# Patient Record
Sex: Male | Born: 1945
Health system: Southern US, Community
[De-identification: ages and names within clinical notes are randomized; demographics above are authoritative.]

## PROBLEM LIST (undated history)

## (undated) DIAGNOSIS — H269 Unspecified cataract: Secondary | ICD-10-CM

## (undated) DIAGNOSIS — R42 Dizziness and giddiness: Secondary | ICD-10-CM

## (undated) DIAGNOSIS — G4730 Sleep apnea, unspecified: Secondary | ICD-10-CM

## (undated) DIAGNOSIS — E785 Hyperlipidemia, unspecified: Secondary | ICD-10-CM

## (undated) DIAGNOSIS — K219 Gastro-esophageal reflux disease without esophagitis: Secondary | ICD-10-CM

## (undated) DIAGNOSIS — R002 Palpitations: Secondary | ICD-10-CM

## (undated) DIAGNOSIS — K2270 Barrett's esophagus without dysplasia: Secondary | ICD-10-CM

## (undated) HISTORY — DX: Dizziness and giddiness: R42

## (undated) HISTORY — PX: UPPER GASTROINTESTINAL ENDOSCOPY: SHX188

## (undated) HISTORY — PX: WISDOM TOOTH EXTRACTION: SHX21

## (undated) HISTORY — DX: Gastro-esophageal reflux disease without esophagitis: K21.9

## (undated) HISTORY — DX: Unspecified cataract: H26.9

## (undated) HISTORY — DX: Hyperlipidemia, unspecified: E78.5

## (undated) HISTORY — DX: Sleep apnea, unspecified: G47.30

## (undated) HISTORY — DX: Barrett's esophagus without dysplasia: K22.70

## (undated) HISTORY — PX: COLONOSCOPY: SHX174

---
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
## 1995-09-20 HISTORY — PX: KNEE ARTHROSCOPY: SUR90

## 1998-07-07 ENCOUNTER — Ambulatory Visit (HOSPITAL_COMMUNITY): Admission: RE | Admit: 1998-07-07 | Discharge: 1998-07-07 | Payer: Self-pay | Admitting: *Deleted

## 2000-11-13 ENCOUNTER — Ambulatory Visit (HOSPITAL_COMMUNITY): Admission: RE | Admit: 2000-11-13 | Discharge: 2000-11-13 | Payer: Self-pay | Admitting: *Deleted

## 2002-11-21 ENCOUNTER — Encounter: Payer: Self-pay | Admitting: Internal Medicine

## 2002-11-21 ENCOUNTER — Encounter: Admission: RE | Admit: 2002-11-21 | Discharge: 2002-11-21 | Payer: Self-pay | Admitting: Internal Medicine

## 2005-09-19 HISTORY — PX: CATARACT EXTRACTION W/ INTRAOCULAR LENS  IMPLANT, BILATERAL: SHX1307

## 2006-11-06 ENCOUNTER — Ambulatory Visit (HOSPITAL_COMMUNITY): Admission: RE | Admit: 2006-11-06 | Discharge: 2006-11-06 | Payer: Self-pay | Admitting: *Deleted

## 2012-02-01 ENCOUNTER — Encounter: Payer: Self-pay | Admitting: Internal Medicine

## 2012-03-09 ENCOUNTER — Ambulatory Visit (AMBULATORY_SURGERY_CENTER): Payer: BC Managed Care – PPO | Admitting: *Deleted

## 2012-03-09 ENCOUNTER — Telehealth: Payer: Self-pay | Admitting: *Deleted

## 2012-03-09 ENCOUNTER — Encounter: Payer: Self-pay | Admitting: Internal Medicine

## 2012-03-09 VITALS — Ht 71.0 in | Wt 206.0 lb

## 2012-03-09 DIAGNOSIS — K2270 Barrett's esophagus without dysplasia: Secondary | ICD-10-CM

## 2012-03-09 NOTE — Telephone Encounter (Signed)
I agree with recall EGD for Barrett's and change the date for recall colon for 2018.

## 2012-03-09 NOTE — Progress Notes (Signed)
When I printed Kenneth Ingram past colon and EGD reports I saw where he had normal colonoscopies in 2002 and 2008 but his EGD showed Barrett's.  I changed his procedure to and Endoscopy and put a recall in for 2018 for his colon.  He was a pt of Dr. Virginia Rochester.

## 2012-03-09 NOTE — Telephone Encounter (Signed)
When I printed Kenneth Ingram past colon and EGD reports I saw where he had normal colonoscopies in 2002 and 2008 but his EGD showed Barrett's.  I changed his procedure to and Endoscopy and put a recall in for 2018 for his colon.  He was a pt of Dr. Virginia Rochester.  Does this meet with your approval?  Wyona Almas

## 2012-03-12 NOTE — Telephone Encounter (Signed)
I spoke with Mrs. Conly and informed her Mr. Lorusso was due to have his EGD instead of a colon and discussed preparation for it and also mailed Mr. Gongora his prep instructions for EGD.

## 2012-03-23 ENCOUNTER — Ambulatory Visit (AMBULATORY_SURGERY_CENTER): Payer: BC Managed Care – PPO | Admitting: Internal Medicine

## 2012-03-23 ENCOUNTER — Encounter: Payer: Self-pay | Admitting: Internal Medicine

## 2012-03-23 VITALS — BP 107/75 | HR 56 | Temp 97.4°F | Resp 12 | Ht 71.0 in | Wt 206.0 lb

## 2012-03-23 DIAGNOSIS — K2270 Barrett's esophagus without dysplasia: Secondary | ICD-10-CM

## 2012-03-23 DIAGNOSIS — K2960 Other gastritis without bleeding: Secondary | ICD-10-CM

## 2012-03-23 MED ORDER — SODIUM CHLORIDE 0.9 % IV SOLN: 500.0000 mL | INTRAVENOUS | Status: DC

## 2012-03-23 NOTE — Op Note (Signed)
[facility] [address] [place], [place]  [address]  ENDOSCOPY PROCEDURE REPORT  PATIENT:  Kenneth Ingram, Kenneth Ingram  MR#:  [id number] BIRTHDATE:  March 15, 1946, 66 yrs. old  GENDER:  male  ENDOSCOPIST:  Hedwig Morton. Juanda Chance, MD Referred by:  Soyla Murphy. Renne Crigler, M.D.  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  The LB GIF-H180 [id number] endoscope was introduced through the mouth and advanced to the second portion of the duodenum, without limitations.  The instrument was slowly withdrawn as the mucosa was fully examined. <<PROCEDUREIMAGES>>  irregular Z-line. With standard forceps, a biopsy was obtained and sent to pathology (see image1, image6, image7, image8, and image9).  A hiatal hernia was found (see image7). small reducible 1-2 cm hiatal hernia  Mild gastritis was found. streaks of erythema antrum With standard forceps, a biopsy was obtained and sent to pathology (see image4 and image2).  Otherwise the examination was normal (see image3 and image5).    Retroflexed views revealed no abnormalities.    The scope was then withdrawn from the patient and the procedure completed.  COMPLICATIONS:  None  ENDOSCOPIC IMPRESSION: 1) Irregular Z-line 2) Hiatal hernia 3) Mild gastritis 4) Otherwise normal examination s/p biopsies gastric antrum to r/o H.pylori s/o Bx's at g-e junction RECOMMENDATIONS: 1) Await biopsy results 2) Anti-reflux regimen to be follow continue current meds  REPEAT EXAM:  In 3 year(s) for.  3 year recall if Barrett's esophagus  present  ______________________________ Hedwig Morton. Juanda Chance, MD  CC:  n. eSIGNED:   Hedwig Morton. Nathanyal Ashmead at 03/23/2012 12:04 PM  Reina Fuse, [phone]

## 2012-03-23 NOTE — Progress Notes (Signed)
Patient did not experience any of the following events: a burn prior to discharge; a fall within the facility; wrong site/side/patient/procedure/implant event; or a hospital transfer or hospital admission upon discharge from the facility. (G8907) Patient did not have preoperative order for IV antibiotic SSI prophylaxis. (G8918)  

## 2012-03-23 NOTE — Patient Instructions (Addendum)

## 2012-03-26 ENCOUNTER — Telehealth: Payer: Self-pay

## 2012-03-26 NOTE — Telephone Encounter (Signed)
Left message

## 2012-03-29 ENCOUNTER — Encounter: Payer: Self-pay | Admitting: Internal Medicine

## 2014-11-25 ENCOUNTER — Other Ambulatory Visit: Payer: Self-pay | Admitting: Internal Medicine

## 2014-11-25 DIAGNOSIS — F17210 Nicotine dependence, cigarettes, uncomplicated: Secondary | ICD-10-CM

## 2014-12-01 ENCOUNTER — Other Ambulatory Visit: Payer: Self-pay | Admitting: Internal Medicine

## 2014-12-01 ENCOUNTER — Ambulatory Visit
Admission: RE | Admit: 2014-12-01 | Discharge: 2014-12-01 | Disposition: A | Discharge status: Home / Self Care | Payer: PPO | Source: Ambulatory Visit | Attending: Internal Medicine | Admitting: Internal Medicine

## 2014-12-01 DIAGNOSIS — F17210 Nicotine dependence, cigarettes, uncomplicated: Secondary | ICD-10-CM

## 2014-12-01 DIAGNOSIS — Z87891 Personal history of nicotine dependence: Secondary | ICD-10-CM

## 2015-11-09 DIAGNOSIS — J069 Acute upper respiratory infection, unspecified: Secondary | ICD-10-CM | POA: Diagnosis not present

## 2015-11-20 DIAGNOSIS — Z125 Encounter for screening for malignant neoplasm of prostate: Secondary | ICD-10-CM | POA: Diagnosis not present

## 2015-11-20 DIAGNOSIS — Z0000 Encounter for general adult medical examination without abnormal findings: Secondary | ICD-10-CM | POA: Diagnosis not present

## 2015-11-26 DIAGNOSIS — G4733 Obstructive sleep apnea (adult) (pediatric): Secondary | ICD-10-CM | POA: Diagnosis not present

## 2015-11-26 DIAGNOSIS — R5383 Other fatigue: Secondary | ICD-10-CM | POA: Diagnosis not present

## 2015-11-26 DIAGNOSIS — J8410 Pulmonary fibrosis, unspecified: Secondary | ICD-10-CM | POA: Diagnosis not present

## 2015-11-26 DIAGNOSIS — Z1212 Encounter for screening for malignant neoplasm of rectum: Secondary | ICD-10-CM | POA: Diagnosis not present

## 2015-11-26 DIAGNOSIS — Z0000 Encounter for general adult medical examination without abnormal findings: Secondary | ICD-10-CM | POA: Diagnosis not present

## 2015-12-09 DIAGNOSIS — L821 Other seborrheic keratosis: Secondary | ICD-10-CM | POA: Diagnosis not present

## 2015-12-09 DIAGNOSIS — D1801 Hemangioma of skin and subcutaneous tissue: Secondary | ICD-10-CM | POA: Diagnosis not present

## 2015-12-09 DIAGNOSIS — D225 Melanocytic nevi of trunk: Secondary | ICD-10-CM | POA: Diagnosis not present

## 2016-03-04 DIAGNOSIS — M5441 Lumbago with sciatica, right side: Secondary | ICD-10-CM | POA: Diagnosis not present

## 2016-03-04 DIAGNOSIS — M5442 Lumbago with sciatica, left side: Secondary | ICD-10-CM | POA: Diagnosis not present

## 2016-03-04 DIAGNOSIS — M47816 Spondylosis without myelopathy or radiculopathy, lumbar region: Secondary | ICD-10-CM | POA: Diagnosis not present

## 2016-03-08 ENCOUNTER — Other Ambulatory Visit: Payer: Self-pay | Admitting: Internal Medicine

## 2016-03-08 DIAGNOSIS — M5442 Lumbago with sciatica, left side: Secondary | ICD-10-CM

## 2016-03-08 DIAGNOSIS — M5441 Lumbago with sciatica, right side: Secondary | ICD-10-CM

## 2016-03-09 ENCOUNTER — Other Ambulatory Visit: Payer: Self-pay | Admitting: Internal Medicine

## 2016-03-09 DIAGNOSIS — M5441 Lumbago with sciatica, right side: Secondary | ICD-10-CM

## 2016-03-09 DIAGNOSIS — M5442 Lumbago with sciatica, left side: Secondary | ICD-10-CM

## 2016-03-15 ENCOUNTER — Other Ambulatory Visit: Payer: Self-pay | Admitting: Internal Medicine

## 2016-03-15 DIAGNOSIS — Z77018 Contact with and (suspected) exposure to other hazardous metals: Secondary | ICD-10-CM

## 2016-03-16 ENCOUNTER — Ambulatory Visit
Admission: RE | Admit: 2016-03-16 | Discharge: 2016-03-16 | Disposition: A | Discharge status: Home / Self Care | Payer: PPO | Source: Ambulatory Visit | Attending: Internal Medicine | Admitting: Internal Medicine

## 2016-03-16 DIAGNOSIS — M5441 Lumbago with sciatica, right side: Secondary | ICD-10-CM

## 2016-03-16 DIAGNOSIS — M4806 Spinal stenosis, lumbar region: Secondary | ICD-10-CM | POA: Diagnosis not present

## 2016-03-16 DIAGNOSIS — M5442 Lumbago with sciatica, left side: Secondary | ICD-10-CM

## 2016-03-16 DIAGNOSIS — Z01818 Encounter for other preprocedural examination: Secondary | ICD-10-CM | POA: Diagnosis not present

## 2016-03-16 DIAGNOSIS — Z77018 Contact with and (suspected) exposure to other hazardous metals: Secondary | ICD-10-CM

## 2016-03-25 DIAGNOSIS — H9122 Sudden idiopathic hearing loss, left ear: Secondary | ICD-10-CM | POA: Diagnosis not present

## 2016-03-25 DIAGNOSIS — H9312 Tinnitus, left ear: Secondary | ICD-10-CM | POA: Diagnosis not present

## 2016-03-25 DIAGNOSIS — H9042 Sensorineural hearing loss, unilateral, left ear, with unrestricted hearing on the contralateral side: Secondary | ICD-10-CM | POA: Diagnosis not present

## 2016-03-30 DIAGNOSIS — M4806 Spinal stenosis, lumbar region: Secondary | ICD-10-CM | POA: Diagnosis not present

## 2016-04-01 DIAGNOSIS — J310 Chronic rhinitis: Secondary | ICD-10-CM | POA: Diagnosis not present

## 2016-04-01 DIAGNOSIS — H9042 Sensorineural hearing loss, unilateral, left ear, with unrestricted hearing on the contralateral side: Secondary | ICD-10-CM | POA: Diagnosis not present

## 2016-04-01 DIAGNOSIS — J343 Hypertrophy of nasal turbinates: Secondary | ICD-10-CM | POA: Diagnosis not present

## 2016-04-01 DIAGNOSIS — M545 Low back pain: Secondary | ICD-10-CM | POA: Diagnosis not present

## 2016-04-01 DIAGNOSIS — H9122 Sudden idiopathic hearing loss, left ear: Secondary | ICD-10-CM | POA: Diagnosis not present

## 2016-04-06 DIAGNOSIS — M4806 Spinal stenosis, lumbar region: Secondary | ICD-10-CM | POA: Diagnosis not present

## 2016-04-15 DIAGNOSIS — H6983 Other specified disorders of Eustachian tube, bilateral: Secondary | ICD-10-CM | POA: Diagnosis not present

## 2016-04-15 DIAGNOSIS — H9042 Sensorineural hearing loss, unilateral, left ear, with unrestricted hearing on the contralateral side: Secondary | ICD-10-CM | POA: Diagnosis not present

## 2016-04-15 DIAGNOSIS — J310 Chronic rhinitis: Secondary | ICD-10-CM | POA: Diagnosis not present

## 2016-06-11 ENCOUNTER — Encounter (HOSPITAL_COMMUNITY): Payer: Self-pay | Admitting: Emergency Medicine

## 2016-06-11 ENCOUNTER — Emergency Department (HOSPITAL_COMMUNITY)
Admission: EM | Admit: 2016-06-11 | Discharge: 2016-06-11 | Disposition: A | Discharge status: Home / Self Care | Payer: PPO | Attending: Emergency Medicine | Admitting: Emergency Medicine

## 2016-06-11 DIAGNOSIS — Z7982 Long term (current) use of aspirin: Secondary | ICD-10-CM | POA: Insufficient documentation

## 2016-06-11 DIAGNOSIS — Z79899 Other long term (current) drug therapy: Secondary | ICD-10-CM | POA: Diagnosis not present

## 2016-06-11 DIAGNOSIS — R1110 Vomiting, unspecified: Secondary | ICD-10-CM | POA: Diagnosis not present

## 2016-06-11 DIAGNOSIS — R42 Dizziness and giddiness: Secondary | ICD-10-CM | POA: Diagnosis not present

## 2016-06-11 DIAGNOSIS — R5383 Other fatigue: Secondary | ICD-10-CM | POA: Insufficient documentation

## 2016-06-11 DIAGNOSIS — Z87891 Personal history of nicotine dependence: Secondary | ICD-10-CM | POA: Diagnosis not present

## 2016-06-11 LAB — BASIC METABOLIC PANEL
ANION GAP: 9 (ref 5–15)
BUN: 14 mg/dL (ref 6–20)
CALCIUM: 8.9 mg/dL (ref 8.9–10.3)
CO2: 23 mmol/L (ref 22–32)
Chloride: 106 mmol/L (ref 101–111)
Creatinine, Ser: 0.9 mg/dL (ref 0.61–1.24)
GFR calc Af Amer: >60 mL/min (ref >60)
GFR calc non Af Amer: >60 mL/min (ref >60)
GLUCOSE: 116 mg/dL — AB (ref 65–99)
POTASSIUM: 3.4 mmol/L — AB (ref 3.5–5.1)
Sodium: 138 mmol/L (ref 135–145)

## 2016-06-11 LAB — CBC
HEMATOCRIT: 39.2 % (ref 39.0–52.0)
HEMOGLOBIN: 13.9 g/dL (ref 13.0–17.0)
MCH: 32.2 pg (ref 26.0–34.0)
MCHC: 35.5 g/dL (ref 30.0–36.0)
MCV: 90.7 fL (ref 78.0–100.0)
Platelets: 207 10*3/uL (ref 150–400)
RBC: 4.32 MIL/uL (ref 4.22–5.81)
RDW: 12.7 % (ref 11.5–15.5)
WBC: 6.1 10*3/uL (ref 4.0–10.5)

## 2016-06-11 LAB — CBG MONITORING, ED: Glucose-Capillary: 108 mg/dL — ABNORMAL HIGH (ref 65–99)

## 2016-06-11 MED ORDER — MECLIZINE HCL 25 MG PO TABS: 25.0000 mg | ORAL_TABLET | Freq: Three times a day (TID) | ORAL | 0 refills | Status: DC | PRN

## 2016-06-11 NOTE — ED Notes (Signed)
Pt attempted to provide urine specimen without success

## 2016-06-11 NOTE — ED Provider Notes (Signed)
[facility] DEPT Provider Note   CSN: [id number] Arrival date & time: 06/11/16  2024     History   Chief Complaint Chief Complaint  Patient presents with  . Dizziness  . Fatigue    HPI Kenneth Ingram. is a 70 y.o. male.  HPI Patient presents with episode of dizziness. States he was at home and began to feel the room back and forth. States he then became nauseous and sweaty. States he vomited most recent thrown up. No fevers. No chest pain. States he is feeling better now. States she's been dealing with ringing in his left ear for a few months and states that it got worse while the episode was going on. States he had some difficulty walking but now it is improved. No headache. No localizing numbness or weakness. He has had some mild episodes of dizziness and states he has felt if he was about to get vertigo in the past. States she has seen Dr. Benjamine Mola for the ringing in his ears. No abdominal pain. Past Medical History:  Diagnosis Date  . GERD (gastroesophageal reflux disease)   . Hyperlipidemia     There are no active problems to display for this patient.   Past Surgical History:  Procedure Laterality Date  . CATARACT EXTRACTION W/ INTRAOCULAR LENS  IMPLANT, BILATERAL  2007  . KNEE ARTHROSCOPY  1997   right  . TONSILLECTOMY  1968       Home Medications    Prior to Admission medications   Medication Sig Start Date End Date Taking? Authorizing Provider  aspirin EC 81 MG tablet Take 81 mg by mouth daily.   Yes Historical Provider, MD  atorvastatin (LIPITOR) 10 MG tablet Take 10 mg by mouth daily. 04/28/16  Yes Historical Provider, MD  GINKGO BILOBA PO Take 1 tablet by mouth daily.   Yes Historical Provider, MD  Multiple Vitamins-Minerals (MULTIVITAMIN WITH MINERALS) tablet Take 1 tablet by mouth daily.   Yes Historical Provider, MD  pantoprazole (PROTONIX) 40 MG tablet Take 1 tablet by mouth Daily. 03/04/12  Yes Historical Provider, MD  prednisoLONE acetate (PRED  FORTE) 1 % ophthalmic suspension Place 1 drop into both eyes 4 (four) times daily as needed for dry eyes. 03/25/16  Yes Historical Provider, MD  Protein POWD Take 1 scoop by mouth daily. In the morning for breakfast.   Yes Historical Provider, MD  meclizine (ANTIVERT) 25 MG tablet Take 1 tablet (25 mg total) by mouth 3 (three) times daily as needed for dizziness. 06/11/16   Davonna Belling, MD    Family History Family History  Problem Relation Age of Onset  . Esophageal cancer Mother     Social History Social History  Substance Use Topics  . Smoking status: Former Research scientist (life sciences)  . Smokeless tobacco: Never Used  . Alcohol use 2.4 oz/week    4 Glasses of wine per week     Allergies   Review of patient's allergies indicates no known allergies.   Review of Systems Review of Systems  Constitutional: Negative for appetite change.  HENT: Negative for dental problem.   Respiratory: Negative for shortness of breath.   Cardiovascular: Negative for chest pain.  Gastrointestinal: Positive for nausea and vomiting.  Genitourinary: Negative for flank pain.  Musculoskeletal: Negative for back pain.  Neurological: Positive for dizziness.  Psychiatric/Behavioral: Negative for behavioral problems.     Physical Exam Updated Vital Signs BP 131/78   Pulse (!) 58   Temp 97.6 F (36.4 C) (Oral)  Resp 20   Ht 5\' 11"  (1.803 m)   Wt 199 lb (90.3 kg)   SpO2 98%   BMI 27.75 kg/m   Physical Exam  Constitutional: He appears well-developed.  HENT:  Head: Atraumatic.  Bilateral TMs normal.  Eyes: EOM are normal.  Cardiovascular: Normal rate.   Pulmonary/Chest: Effort normal.  Abdominal: There is no tenderness.  Musculoskeletal: Normal range of motion.  Neurological: He is alert.  Nystagmus with gaze to the left. Extraocular movements intact. Pupils reactive. Finger-nose intact bilaterally. Heel-to-shin normal bilaterally. Normal gait. No Romberg.  Skin: Skin is warm.     ED Treatments /  Results  Labs (all labs ordered are listed, but only abnormal results are displayed) Labs Reviewed  BASIC METABOLIC PANEL - Abnormal; Notable for the following:       Result Value   Potassium 3.4 (*)    Glucose, Bld 116 (*)    All other components within normal limits  CBG MONITORING, ED - Abnormal; Notable for the following:    Glucose-Capillary 108 (*)    All other components within normal limits  CBC  URINALYSIS, ROUTINE W REFLEX MICROSCOPIC (NOT AT [facility])    EKG  EKG Interpretation  Date/Time:  Saturday June 11 2016 20:47:10 EDT Ventricular Rate:  63 PR Interval:    QRS Duration: 83 QT Interval:  445 QTC Calculation: 456 R Axis:   85 Text Interpretation:  Sinus rhythm Borderline right axis deviation Reconfirmed by Alvino Chapel  MD, Ovid Curd [phone]) on 06/11/2016 11:35:59 PM       Radiology No results found.  Procedures Procedures (including critical care time)  Medications Ordered in ED Medications - No data to display   Initial Impression / Assessment and Plan / ED Course  I have reviewed the triage vital signs and the nursing notes.  Pertinent labs & imaging results that were available during my care of the patient were reviewed by me and considered in my medical decision making (see chart for details).  Clinical Course    Patient with likely peripheral vertigo. Has had ringing of the ears worsened during the episode. Had vomiting. Symptoms has resolved. Doubt arrhythmia as a cause. Doubt central vertigo. Will have follow-up with Dr. Benjamine Mola, who the patient is seen in the past. Normal neurologic exam except for some mild nystagmus with gaze to the left.  Final Clinical Impressions(s) / ED Diagnoses   Final diagnoses:  Vertigo    New Prescriptions New Prescriptions   MECLIZINE (ANTIVERT) 25 MG TABLET    Take 1 tablet (25 mg total) by mouth 3 (three) times daily as needed for dizziness.     Davonna Belling, MD 06/11/16 [phone]

## 2016-06-11 NOTE — ED Notes (Signed)
MD Pickering at bedside.  MD sts pt is going home and doesn't need an IV

## 2016-06-11 NOTE — ED Triage Notes (Addendum)
Per [facility], pt from home c/o dizziness and 1 episode of vomiting. Pt reports difficulty walking due to dizziness after two vodka and tonic drinks prior to dinner. Hx vertigo.

## 2016-06-17 DIAGNOSIS — H9042 Sensorineural hearing loss, unilateral, left ear, with unrestricted hearing on the contralateral side: Secondary | ICD-10-CM | POA: Diagnosis not present

## 2016-06-17 DIAGNOSIS — R42 Dizziness and giddiness: Secondary | ICD-10-CM | POA: Diagnosis not present

## 2016-06-23 DIAGNOSIS — Z23 Encounter for immunization: Secondary | ICD-10-CM | POA: Diagnosis not present

## 2016-06-23 DIAGNOSIS — R221 Localized swelling, mass and lump, neck: Secondary | ICD-10-CM | POA: Diagnosis not present

## 2016-07-01 ENCOUNTER — Other Ambulatory Visit (INDEPENDENT_AMBULATORY_CARE_PROVIDER_SITE_OTHER): Payer: Self-pay | Admitting: Otolaryngology

## 2016-07-01 DIAGNOSIS — H918X9 Other specified hearing loss, unspecified ear: Secondary | ICD-10-CM

## 2016-07-04 ENCOUNTER — Other Ambulatory Visit: Payer: Self-pay | Admitting: Internal Medicine

## 2016-07-04 DIAGNOSIS — R221 Localized swelling, mass and lump, neck: Secondary | ICD-10-CM

## 2016-07-13 ENCOUNTER — Ambulatory Visit
Admission: RE | Admit: 2016-07-13 | Discharge: 2016-07-13 | Disposition: A | Discharge status: Home / Self Care | Payer: PPO | Source: Ambulatory Visit | Attending: Internal Medicine | Admitting: Internal Medicine

## 2016-07-13 ENCOUNTER — Ambulatory Visit
Admission: RE | Admit: 2016-07-13 | Discharge: 2016-07-13 | Disposition: A | Discharge status: Home / Self Care | Payer: PPO | Source: Ambulatory Visit | Attending: Otolaryngology | Admitting: Otolaryngology

## 2016-07-13 ENCOUNTER — Other Ambulatory Visit: Payer: PPO

## 2016-07-13 DIAGNOSIS — E041 Nontoxic single thyroid nodule: Secondary | ICD-10-CM | POA: Diagnosis not present

## 2016-07-13 DIAGNOSIS — R42 Dizziness and giddiness: Secondary | ICD-10-CM | POA: Diagnosis not present

## 2016-07-13 DIAGNOSIS — R221 Localized swelling, mass and lump, neck: Secondary | ICD-10-CM

## 2016-07-13 DIAGNOSIS — H918X9 Other specified hearing loss, unspecified ear: Secondary | ICD-10-CM

## 2016-07-13 MED ORDER — GADOBENATE DIMEGLUMINE 529 MG/ML IV SOLN
18.0000 mL | Freq: Once | INTRAVENOUS | Status: AC | PRN
  Administered 2016-07-13: 18 mL via INTRAVENOUS

## 2016-08-02 DIAGNOSIS — Z961 Presence of intraocular lens: Secondary | ICD-10-CM | POA: Diagnosis not present

## 2016-08-02 DIAGNOSIS — H52203 Unspecified astigmatism, bilateral: Secondary | ICD-10-CM | POA: Diagnosis not present

## 2016-08-02 DIAGNOSIS — D3132 Benign neoplasm of left choroid: Secondary | ICD-10-CM | POA: Diagnosis not present

## 2016-08-23 DIAGNOSIS — R42 Dizziness and giddiness: Secondary | ICD-10-CM | POA: Diagnosis not present

## 2016-08-23 DIAGNOSIS — H9042 Sensorineural hearing loss, unilateral, left ear, with unrestricted hearing on the contralateral side: Secondary | ICD-10-CM | POA: Diagnosis not present

## 2016-08-30 DIAGNOSIS — T07XXXA Unspecified multiple injuries, initial encounter: Secondary | ICD-10-CM | POA: Diagnosis not present

## 2016-11-23 DIAGNOSIS — Z125 Encounter for screening for malignant neoplasm of prostate: Secondary | ICD-10-CM | POA: Diagnosis not present

## 2016-11-23 DIAGNOSIS — Z0000 Encounter for general adult medical examination without abnormal findings: Secondary | ICD-10-CM | POA: Diagnosis not present

## 2016-11-28 DIAGNOSIS — Z0001 Encounter for general adult medical examination with abnormal findings: Secondary | ICD-10-CM | POA: Diagnosis not present

## 2016-11-28 DIAGNOSIS — I2510 Atherosclerotic heart disease of native coronary artery without angina pectoris: Secondary | ICD-10-CM | POA: Diagnosis not present

## 2016-11-28 DIAGNOSIS — G2581 Restless legs syndrome: Secondary | ICD-10-CM | POA: Diagnosis not present

## 2016-11-28 DIAGNOSIS — K2270 Barrett's esophagus without dysplasia: Secondary | ICD-10-CM | POA: Diagnosis not present

## 2016-11-28 DIAGNOSIS — Z1212 Encounter for screening for malignant neoplasm of rectum: Secondary | ICD-10-CM | POA: Diagnosis not present

## 2016-12-08 DIAGNOSIS — D1801 Hemangioma of skin and subcutaneous tissue: Secondary | ICD-10-CM | POA: Diagnosis not present

## 2016-12-08 DIAGNOSIS — L821 Other seborrheic keratosis: Secondary | ICD-10-CM | POA: Diagnosis not present

## 2016-12-08 DIAGNOSIS — L570 Actinic keratosis: Secondary | ICD-10-CM | POA: Diagnosis not present

## 2016-12-08 DIAGNOSIS — T07XXXA Unspecified multiple injuries, initial encounter: Secondary | ICD-10-CM | POA: Diagnosis not present

## 2016-12-08 DIAGNOSIS — L814 Other melanin hyperpigmentation: Secondary | ICD-10-CM | POA: Diagnosis not present

## 2016-12-20 DIAGNOSIS — R972 Elevated prostate specific antigen [PSA]: Secondary | ICD-10-CM | POA: Diagnosis not present

## 2017-01-30 DIAGNOSIS — R972 Elevated prostate specific antigen [PSA]: Secondary | ICD-10-CM | POA: Diagnosis not present

## 2017-02-10 ENCOUNTER — Encounter: Payer: Self-pay | Admitting: Gastroenterology

## 2017-02-22 ENCOUNTER — Encounter: Payer: Self-pay | Admitting: Gastroenterology

## 2017-04-10 ENCOUNTER — Ambulatory Visit (INDEPENDENT_AMBULATORY_CARE_PROVIDER_SITE_OTHER): Payer: PPO | Admitting: Gastroenterology

## 2017-04-10 ENCOUNTER — Encounter: Payer: Self-pay | Admitting: Gastroenterology

## 2017-04-10 ENCOUNTER — Encounter (INDEPENDENT_AMBULATORY_CARE_PROVIDER_SITE_OTHER): Payer: Self-pay

## 2017-04-10 VITALS — BP 140/70 | HR 72 | Ht 71.0 in | Wt 209.0 lb

## 2017-04-10 DIAGNOSIS — Z800 Family history of malignant neoplasm of digestive organs: Secondary | ICD-10-CM | POA: Diagnosis not present

## 2017-04-10 DIAGNOSIS — K219 Gastro-esophageal reflux disease without esophagitis: Secondary | ICD-10-CM | POA: Diagnosis not present

## 2017-04-10 DIAGNOSIS — Z1211 Encounter for screening for malignant neoplasm of colon: Secondary | ICD-10-CM | POA: Diagnosis not present

## 2017-04-10 MED ORDER — NA SULFATE-K SULFATE-MG SULF 17.5-3.13-1.6 GM/177ML PO SOLN: 1.0000 | Freq: Once | ORAL | 0 refills | Status: AC

## 2017-04-10 MED ORDER — PANTOPRAZOLE SODIUM 20 MG PO TBEC: 20.0000 mg | DELAYED_RELEASE_TABLET | Freq: Every day | ORAL | 3 refills | Status: DC

## 2017-04-10 NOTE — Progress Notes (Signed)
HPI :  71 y/o male with a history of GERD, HLD, here for a new patient visit to me to be evaluated for GERD. He has not been seen in 2013.   On Protonix 40mg  once day. He thinks it works pretty well for his symptoms, he denies much breakthrough. He reports eating late at night sometimes can precipitate nocturnal symptoms. He thinks 99% of ytime it works well. No dysphagia. No nausea or vomiting. He thinks he has been on protonix for a very long time, > 10 years ago at 40mg  dosing.  Mother died of esophageal cancer - she had it age 72. Prior tobacco user, quit in 1996. He is concerned about his risks for esophageal cancer.  Last colonoscopy he thinks in 2008. He denies any blood in the stools routinely, he thinks he has had an anal fissure / hemorrhoid in the past, used nitroglycerin for it in the past. He states this has come and gone over time. He has occasional straining. He denies anything for his bowels. Great uncle died of colon cancer, no other FH of colon cancer.   EGD 03/23/2012 - irregular z-line, small hiatal hernia - biopsies negative for Barrett's EGD 2008 - reportedly had Barrett's esophagus - records not available   Past Medical History:  Diagnosis Date  . GERD (gastroesophageal reflux disease)   . Hyperlipidemia      Past Surgical History:  Procedure Laterality Date  . CATARACT EXTRACTION W/ INTRAOCULAR LENS  IMPLANT, BILATERAL  2007  . KNEE ARTHROSCOPY  1997   right  . TONSILLECTOMY  1968   Family History  Problem Relation Age of Onset  . Esophageal cancer Mother    Social History  Substance Use Topics  . Smoking status: Former Research scientist (life sciences)  . Smokeless tobacco: Never Used  . Alcohol use 2.4 oz/week    4 Glasses of wine per week   Current Outpatient Prescriptions  Medication Sig Dispense Refill  . aspirin EC 81 MG tablet Take 81 mg by mouth daily.    Marland Kitchen atorvastatin (LIPITOR) 10 MG tablet Take 10 mg by mouth daily.    Marland Kitchen GINKGO BILOBA PO Take 1 tablet by mouth daily.     . meclizine (ANTIVERT) 25 MG tablet Take 1 tablet (25 mg total) by mouth 3 (three) times daily as needed for dizziness. 10 tablet 0  . Multiple Vitamins-Minerals (MULTIVITAMIN WITH MINERALS) tablet Take 1 tablet by mouth daily.    . pantoprazole (PROTONIX) 40 MG tablet Take 1 tablet by mouth Daily.    . prednisoLONE acetate (PRED FORTE) 1 % ophthalmic suspension Place 1 drop into both eyes 4 (four) times daily as needed for dry eyes.    . Protein POWD Take 1 scoop by mouth daily. In the morning for breakfast.     No current facility-administered medications for this visit.    No Known Allergies   Review of Systems: All systems reviewed and negative except where noted in HPI.   Lab Results  Component Value Date   WBC 6.1 06/11/2016   HGB 13.9 06/11/2016   HCT 39.2 06/11/2016   MCV 90.7 06/11/2016   PLT 207 06/11/2016    Lab Results  Component Value Date   CREATININE 0.90 06/11/2016   BUN 14 06/11/2016   NA 138 06/11/2016   K 3.4 (L) 06/11/2016   CL 106 06/11/2016   CO2 23 06/11/2016    No results found for: ALT, AST, GGT, ALKPHOS, BILITOT   Physical Exam: BP 140/70  Pulse 72   Ht 5\' 11"  (1.803 m)   Wt 209 lb (94.8 kg)   BMI 29.15 kg/m  Constitutional: Pleasant,well-developed, male in no acute distress. HEENT: Normocephalic and atraumatic. Conjunctivae are normal. No scleral icterus. Neck supple.  Cardiovascular: Normal rate, regular rhythm.  Pulmonary/chest: Effort normal and breath sounds normal. No wheezing, rales or rhonchi. Abdominal: Soft, nondistended, nontender. there are no masses palpable. No hepatomegaly. Extremities: no edema Lymphadenopathy: No cervical adenopathy noted. Neurological: Alert and oriented to person place and time. Skin: Skin is warm and dry. No rashes noted. Psychiatric: Normal mood and affect. Behavior is normal.   ASSESSMENT AND PLAN: 71 year old male here for assessment of the following issues:  GERD / family history of  esophageal cancer - he has long-standing reflux and a remote history of reported short segment Barrett's. His last endoscopy was remarkable for an irregular Z line for which biopsies were negative for Barrett's. His mother died of esophageal cancer at a late age however he is very concerned about it and is requesting upper endoscopy. I reassured him that given the findings of his last endoscopy, it's unlikely he has any concerning lesions in his esophagus, however I offered him the exam for peace of mind. Following discussion of risks and benefits he wanted proceed with upper endoscopy. Otherwise discussed risks and benefits of long-term PPI use with him. Recommend we reduce dose of Protonix 20 mg a day and see if he tolerates it. He was agreeable to this.  Colon cancer screening - due for routine colon cancer screening at this time. I discussed optical colonoscopy with him including risks and benefits and wanted to proceed and have this done at the same time as his endoscopy. Further recommendations pending the results  Arthur Cellar, MD [facility] Pager [phone]  CC: Deland Pretty, MD

## 2017-04-10 NOTE — Patient Instructions (Addendum)
If you are age 69 or older, your body mass index should be between 23-30. Your Body mass index is 29.15 kg/m. If this is out of the aforementioned range listed, please consider follow up with your Primary Care Provider.  If you are age 18 or younger, your body mass index should be between 19-25. Your Body mass index is 29.15 kg/m. If this is out of the aformentioned range listed, please consider follow up with your Primary Care Provider.   We have sent the following medications to your pharmacy for you to pick up at your convenience:  Protonix  -changed to 20mg  daily  Suprep  You have been scheduled for an endoscopy and colonoscopy. Please follow the written instructions given to you at your visit today. Please pick up your prep supplies at the pharmacy within the next 1-3 days. If you use inhalers (even only as needed), please bring them with you on the day of your procedure. Your physician has requested that you go to www.startemmi.com and enter the access code given to you at your visit today. This web site gives a general overview about your procedure. However, you should still follow specific instructions given to you by our office regarding your preparation for the procedure.  Thank you.

## 2017-05-18 ENCOUNTER — Encounter: Payer: Self-pay | Admitting: Gastroenterology

## 2017-05-29 ENCOUNTER — Ambulatory Visit (AMBULATORY_SURGERY_CENTER): Payer: PPO | Admitting: Gastroenterology

## 2017-05-29 ENCOUNTER — Encounter: Payer: PPO | Admitting: Gastroenterology

## 2017-05-29 ENCOUNTER — Encounter: Payer: Self-pay | Admitting: Gastroenterology

## 2017-05-29 VITALS — BP 114/71 | HR 57 | Temp 97.8°F | Resp 12 | Ht 71.0 in | Wt 209.0 lb

## 2017-05-29 DIAGNOSIS — K219 Gastro-esophageal reflux disease without esophagitis: Secondary | ICD-10-CM

## 2017-05-29 DIAGNOSIS — Z1211 Encounter for screening for malignant neoplasm of colon: Secondary | ICD-10-CM | POA: Diagnosis not present

## 2017-05-29 DIAGNOSIS — K210 Gastro-esophageal reflux disease with esophagitis: Secondary | ICD-10-CM | POA: Diagnosis not present

## 2017-05-29 DIAGNOSIS — Z800 Family history of malignant neoplasm of digestive organs: Secondary | ICD-10-CM

## 2017-05-29 DIAGNOSIS — K2970 Gastritis, unspecified, without bleeding: Secondary | ICD-10-CM | POA: Diagnosis not present

## 2017-05-29 MED ORDER — SODIUM CHLORIDE 0.9 % IV SOLN: 500.0000 mL | INTRAVENOUS | Status: DC

## 2017-05-29 MED ORDER — PANTOPRAZOLE SODIUM 40 MG PO TBEC: 40.0000 mg | DELAYED_RELEASE_TABLET | Freq: Every day | ORAL | 3 refills | Status: DC

## 2017-05-29 NOTE — Op Note (Signed)
[facility] Patient Name: Aubery Douthat Procedure Date: 05/29/2017 2:28 PM MRN: [id number] Endoscopist: Remo Lipps P. Armbruster MD, MD Age: 71 Referring MD:  Date of Birth: April 19, 1946 Gender: Male Account #: [id number] Procedure:                Colonoscopy Indications:              Screening for colorectal malignant neoplasm Medicines:                Monitored Anesthesia Care Procedure:                Pre-Anesthesia Assessment:                           - Prior to the procedure, a History and Physical                            was performed, and patient medications and                            allergies were reviewed. The patient's tolerance of                            previous anesthesia was also reviewed. The risks                            and benefits of the procedure and the sedation                            options and risks were discussed with the patient.                            All questions were answered, and informed consent                            was obtained. Prior Anticoagulants: The patient has                            taken no previous anticoagulant or antiplatelet                            agents. ASA Grade Assessment: II - A patient with                            mild systemic disease. After reviewing the risks                            and benefits, the patient was deemed in                            satisfactory condition to undergo the procedure.                           After obtaining informed consent, the colonoscope  was passed under direct vision. Throughout the                            procedure, the patient's blood pressure, pulse, and                            oxygen saturations were monitored continuously. The                            Colonoscope was introduced through the anus and                            advanced to the the cecum, identified by                            appendiceal orifice  and ileocecal valve. The                            colonoscopy was performed without difficulty. The                            patient tolerated the procedure well. The quality                            of the bowel preparation was good. The ileocecal                            valve, appendiceal orifice, and rectum were                            photographed. Scope In: 2:44:17 PM Scope Out: 2:59:58 PM Scope Withdrawal Time: 0 hours 12 minutes 56 seconds  Total Procedure Duration: 0 hours 15 minutes 41 seconds  Findings:                 The perianal and digital rectal examinations were                            normal.                           Internal hemorrhoids were found during                            retroflexion. The hemorrhoids were small.                           The exam was otherwise without abnormality on                            direct and retroflexion views. No polyps Complications:            No immediate complications. Estimated blood loss:                            None. Estimated Blood Loss:  Estimated blood loss: none. Impression:               - Internal hemorrhoids.                           - The examination was otherwise normal on direct                            and retroflexion views.                           - No specimens collected. Recommendation:           - Patient has a contact number available for                            emergencies. The signs and symptoms of potential                            delayed complications were discussed with the                            patient. Return to normal activities tomorrow.                            Written discharge instructions were provided to the                            patient.                           - Resume previous diet.                           - Continue present medications.                           - You do not warrant further colon cancer screening                             for 10 years, at that time you will be 71 years                            old. Many patients stop having colon cancer                            screening at age 77, depending on multiple factors.                            You can see me in 10 years to determine if you wish                            to have further colon cancer screening. Remo Lipps P. Armbruster MD, MD 05/29/2017 3:04:22 PM This report has been signed electronically.

## 2017-05-29 NOTE — Op Note (Signed)
Kenneth Ingram: Kenneth Ingram Procedure Date: 05/29/2017 2:28 PM MRN: [id number] Endoscopist: Remo Lipps P. Yan Pankratz MD, MD Age: 71 Referring MD:  Date of Birth: 1946-02-09 Gender: Male Account #: [id number] Procedure:                Upper GI endoscopy Indications:              Heartburn, Family history of esophageal cancer                            (mother diagnosed age 75s) Medicines:                Monitored Anesthesia Care Procedure:                Pre-Anesthesia Assessment:                           - Prior to the procedure, a History and Physical                            was performed, and patient medications and                            allergies were reviewed. The patient's tolerance of                            previous anesthesia was also reviewed. The risks                            and benefits of the procedure and the sedation                            options and risks were discussed with the patient.                            All questions were answered, and informed consent                            was obtained. Prior Anticoagulants: The patient has                            taken no previous anticoagulant or antiplatelet                            agents. ASA Grade Assessment: II - A patient with                            mild systemic disease. After reviewing the risks                            and benefits, the patient was deemed in                            satisfactory condition to undergo the procedure.  After obtaining informed consent, the endoscope was                            passed under direct vision. Throughout the                            procedure, the patient's blood pressure, pulse, and                            oxygen saturations were monitored continuously. The                            Endoscope was introduced through the mouth, and                            advanced to the second part  of duodenum. The upper                            GI endoscopy was accomplished without difficulty.                            The patient tolerated the procedure well. Scope In: Scope Out: Findings:                 Esophagogastric landmarks were identified: the                            Z-line was found at 40 cm, the gastroesophageal                            junction was found at 40 cm and the upper extent of                            the gastric folds was found at 43 cm from the                            incisors.                           A 3 cm hiatal hernia was present.                           The Z-line was irregular and was found 40 cm from                            the incisors, one tongue of salmon colored mucosa                            without nodularity noted, roughly 96mm to 61mm in                            length without nodularity . Biopsies were taken  with a cold forceps for histology.                           2 areas of suspected ectopic gastric mucosa were                            found in the upper third of the esophagus, around                            62mm from the incisors. One of the areas was small                            (few mm) while the other was larger than typically                            noted ectopic gastric mucosa (perhaps 2cm in                            length). Biopsies were taken with a cold forceps                            for histology to ensure no Barrett's esophagus.                           The exam of the esophagus was otherwise normal.                           The entire examined stomach was normal.                           The duodenal bulb and second portion of the                            duodenum were normal. Complications:            No immediate complications. Estimated blood loss:                            Minimal. Estimated Blood Loss:     Estimated blood loss was  minimal. Impression:               - Esophagogastric landmarks identified.                           - 3 cm hiatal hernia.                           - Z-line irregular, 40 cm from the incisors.                            Biopsied.                           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 Biopsied.                           - Normal stomach.                           - Normal duodenal bulb and second portion of the                            duodenum. Recommendation:           - Patient has a contact number available for                            emergencies. The signs and symptoms of potential                            delayed complications were discussed with the                            patient. Return to normal activities tomorrow.                            Written discharge instructions were provided to the                            patient.                           - Resume previous diet.                           - Continue present medications (refill protonix at                            40mg  / day, trial of protonix 20mg  / day did not                            work as well)                           - Await pathology results. Remo Lipps P. Zanasia Hickson MD, MD 05/29/2017 3:10:25 PM This report has been signed electronically.

## 2017-05-29 NOTE — Progress Notes (Signed)
Called to room to assist during endoscopic procedure.  Patient ID and intended procedure confirmed with present staff. Received instructions for my participation in the procedure from the performing physician.  

## 2017-05-29 NOTE — Progress Notes (Signed)
Report given to PACU, vss 

## 2017-05-29 NOTE — Patient Instructions (Addendum)
Impressions/recommendations:  GERD (handout given)  YOU HAD AN ENDOSCOPIC PROCEDURE TODAY AT [facility]:   Refer to the procedure report that was given to you for any specific questions about what was found during the examination.  If the procedure report does not answer your questions, please call your gastroenterologist to clarify.  If you requested that your care partner not be given the details of your procedure findings, then the procedure report has been included in a sealed envelope for you to review at your convenience later.  YOU SHOULD EXPECT: Some feelings of bloating in the abdomen. Passage of more gas than usual.  Walking can help get rid of the air that was put into your GI tract during the procedure and reduce the bloating. If you had a lower endoscopy (such as a colonoscopy or flexible sigmoidoscopy) you may notice spotting of blood in your stool or on the toilet paper. If you underwent a bowel prep for your procedure, you may not have a normal bowel movement for a few days.  Please Note:  You might notice some irritation and congestion in your nose or some drainage.  This is from the oxygen used during your procedure.  There is no need for concern and it should clear up in a day or so.  SYMPTOMS TO REPORT IMMEDIATELY:   Following upper endoscopy (EGD)  Vomiting of blood or coffee ground material  New chest pain or pain under the shoulder blades  Painful or persistently difficult swallowing  New shortness of breath  Fever of 100F or higher  Black, tarry-looking stools Following Colonoscopy:            Passing a large amount of blood rectally            Severe abdominal pain            Sudden abdominal swelling            Temperature greater than 100 degrees    For urgent or emergent issues, a gastroenterologist can be reached at any hour by calling [phone].   DIET:  We do recommend a small meal at first, but then you may proceed to your regular  diet.  Drink plenty of fluids but you should avoid alcoholic beverages for 24 hours.  ACTIVITY:  You should plan to take it easy for the rest of today and you should NOT DRIVE or use heavy machinery until tomorrow (because of the sedation medicines used during the test).    FOLLOW UP: Our staff will call the number listed on your records the next business day following your procedure to check on you and address any questions or concerns that you may have regarding the information given to you following your procedure. If we do not reach you, we will leave a message.  However, if you are feeling well and you are not experiencing any problems, there is no need to return our call.  We will assume that you have returned to your regular daily activities without incident.  If any biopsies were taken you will be contacted by phone or by letter within the next 1-3 weeks.  Please call us at [phone] if you have not heard about the biopsies in 3 weeks.    SIGNATURES/CONFIDENTIALITY: You and/or your care partner have signed paperwork which will be entered into your electronic medical record.  These signatures attest to the fact that that the information above on your After Visit Summary has been  reviewed and is understood.  Full responsibility of the confidentiality of this discharge information lies with you and/or your care-partner. 

## 2017-05-30 ENCOUNTER — Telehealth: Payer: Self-pay

## 2017-05-30 ENCOUNTER — Other Ambulatory Visit: Payer: Self-pay

## 2017-05-30 NOTE — Telephone Encounter (Signed)
  Follow up Call-  Call back number 05/29/2017  Post procedure Call Back phone  # [phone] cell  Permission to leave phone message Yes  Some recent data might be hidden     Patient questions:  Do you have a fever, pain , or abdominal swelling? No. Pain Score  0 *  Have you tolerated food without any problems? Yes.    Have you been able to return to your normal activities? Yes.    Do you have any questions about your discharge instructions: Diet   No. Medications  No. Follow up visit  No.  Do you have questions or concerns about your Care? No.  Actions: * If pain score is 4 or above: No action needed, pain <4.

## 2017-05-30 NOTE — Telephone Encounter (Signed)
Left message on answering machine. 

## 2017-06-06 ENCOUNTER — Encounter: Payer: Self-pay | Admitting: Gastroenterology

## 2017-06-08 DIAGNOSIS — M79644 Pain in right finger(s): Secondary | ICD-10-CM | POA: Diagnosis not present

## 2017-06-08 DIAGNOSIS — S6991XA Unspecified injury of right wrist, hand and finger(s), initial encounter: Secondary | ICD-10-CM | POA: Diagnosis not present

## 2017-06-08 DIAGNOSIS — M79641 Pain in right hand: Secondary | ICD-10-CM | POA: Diagnosis not present

## 2017-07-19 DIAGNOSIS — Z23 Encounter for immunization: Secondary | ICD-10-CM | POA: Diagnosis not present

## 2017-08-29 IMAGING — CR DG ORBITS FOR FOREIGN BODY
2 series · 2 of 2 positions shown · non-contrast
Comparison: None.

CLINICAL DATA: Remote metal exposure. Pre MRI screen.

EXAM:
ORBITS FOR FOREIGN BODY - 2 VIEW

[w orbit pa (1 of 2)]
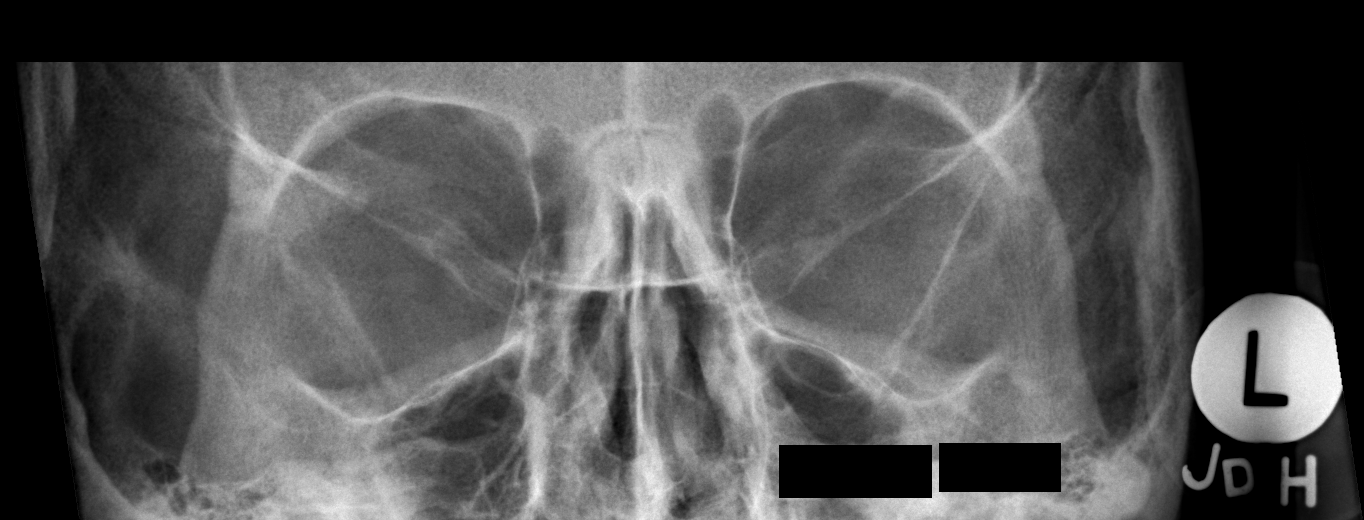

[w orbit pa (2 of 2)]
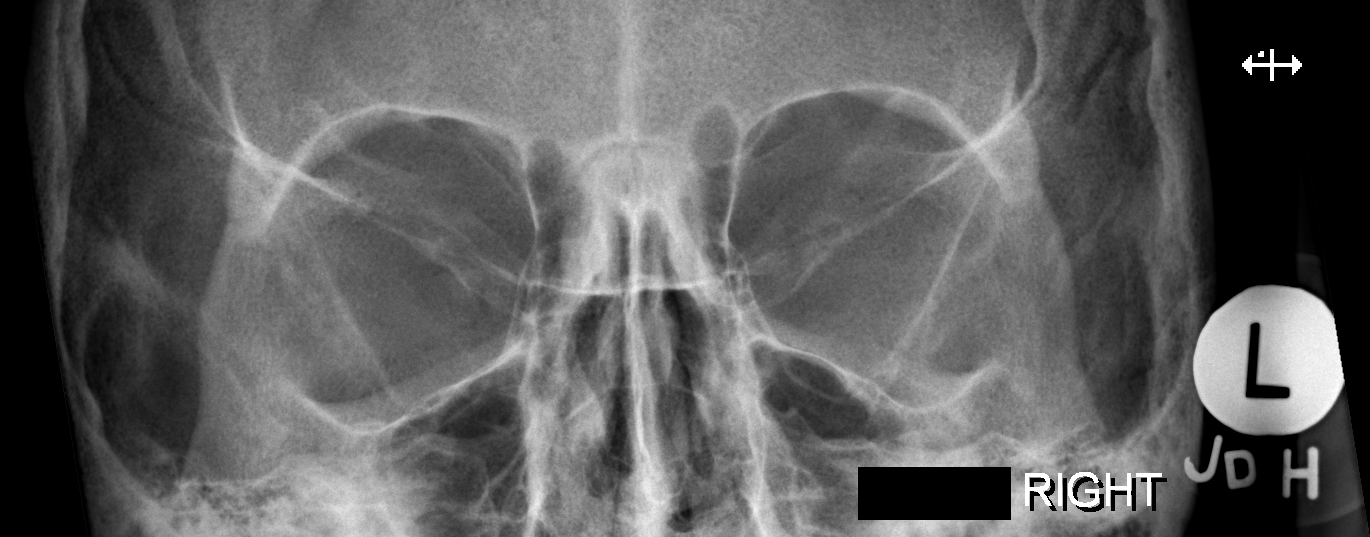

[2 of 2 positions shown; findings below may reference images not displayed]

FINDINGS: There is no evidence of metallic foreign body within the orbits. No
significant bone abnormality identified.
IMPRESSION: No evidence of metallic foreign body within the orbits.

## 2017-08-29 IMAGING — MR MR LUMBAR SPINE W/O CM
4 of 5 series · 18 of 48 positions shown · non-contrast
Comparison: None.

CLINICAL DATA: Low back pain, bilateral leg pain since 0110.

EXAM:
MRI LUMBAR SPINE WITHOUT CONTRAST
TECHNIQUE: Multiplanar, multisequence MR imaging of the lumbar spine was
performed. No intravenous contrast was administered.

[Series 6: T2 · sagittal · 4.0mm · 0.73mm/px · 6 of 15 slices shown (1 of 2)]
[im 1/15]
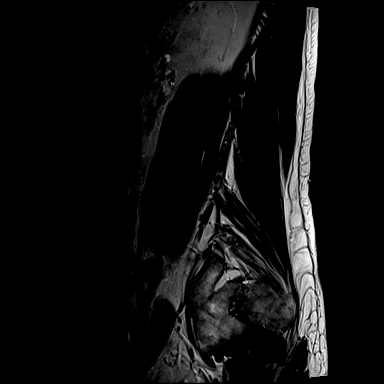
[im 3/15]
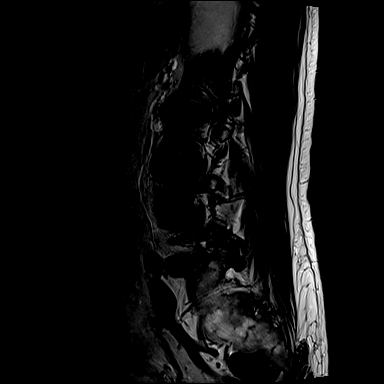
[im 6/15]
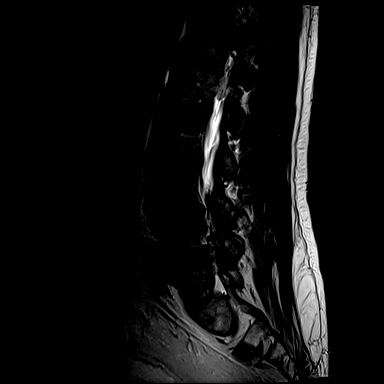
[im 9/15]
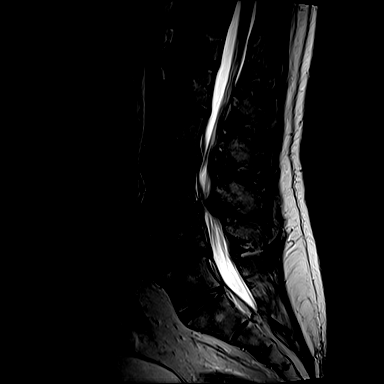
[im 12/15]
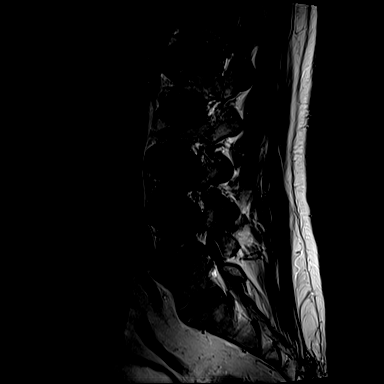
[im 15/15]
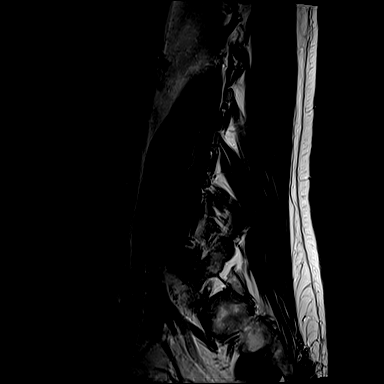

[Series 7: T1 · sagittal · 4.0mm · 0.73mm/px · 3 of 15 slices shown (1 of 2)]
[im 3/15]
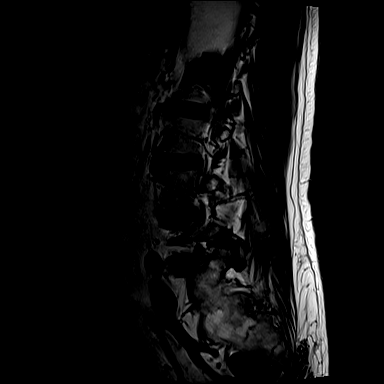
[im 9/15]
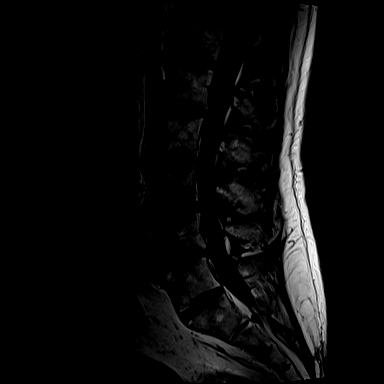
[im 15/15]
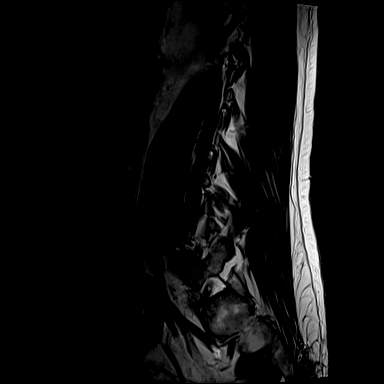

[Series 13: T2 · axial · 4.0mm · 0.28mm/px · z∈[-138,+24]mm · 6 of 36 slices shown (2 of 2)]
[im 1/36]
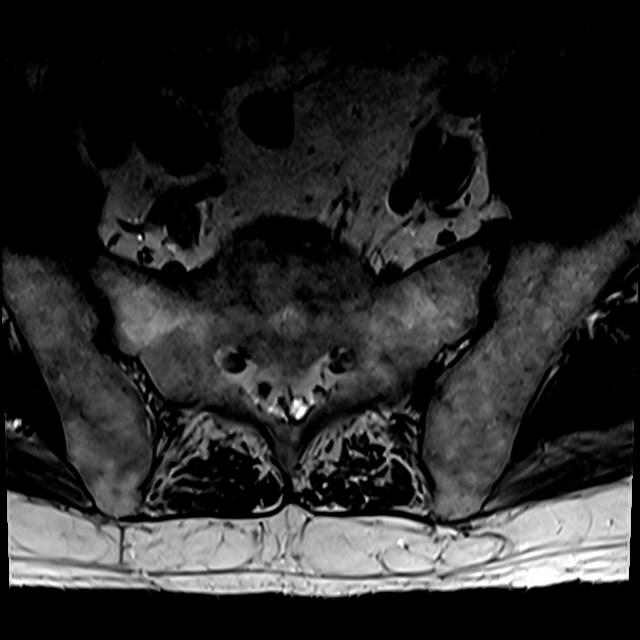
[im 6/36]
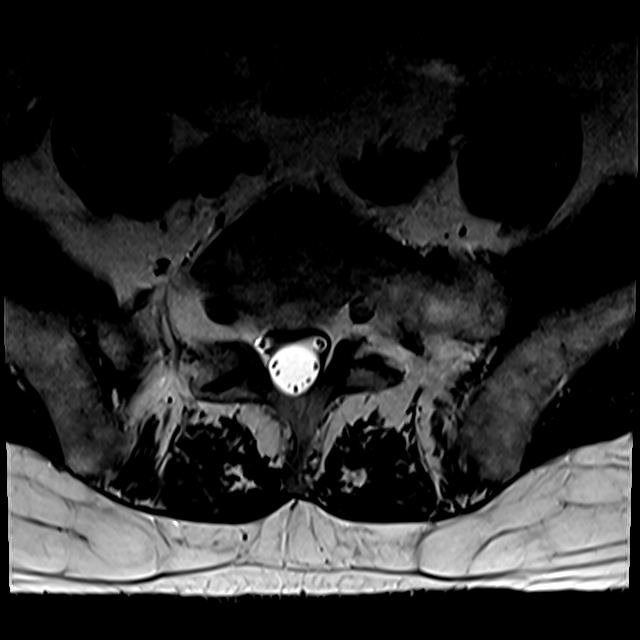
[im 11/36]
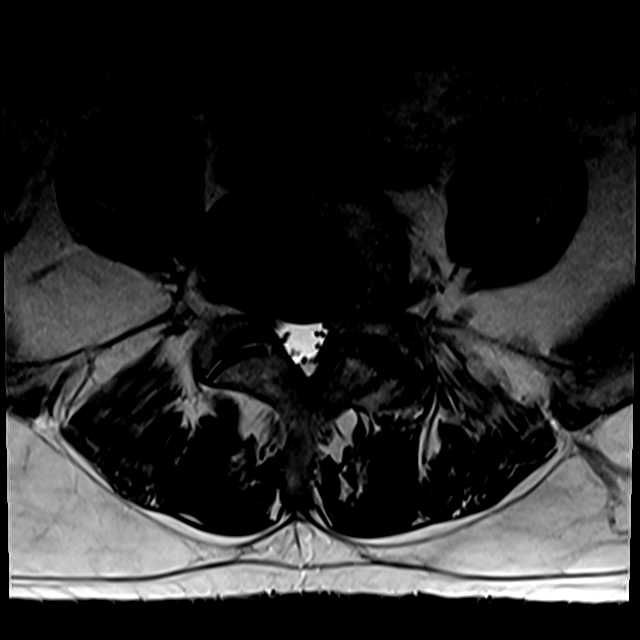
[im 16/36]
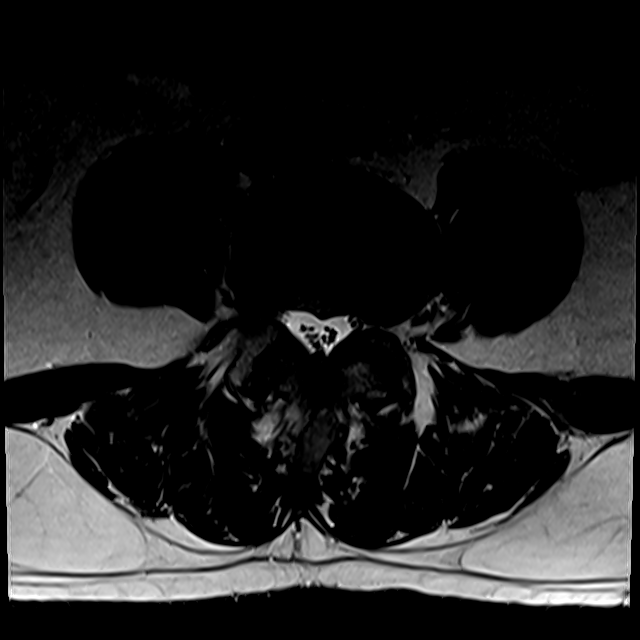
[im 18/36]
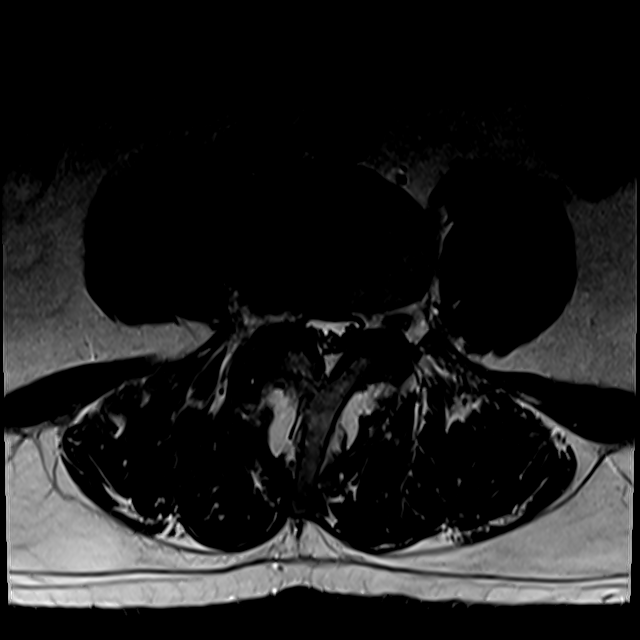
[im 31/36]
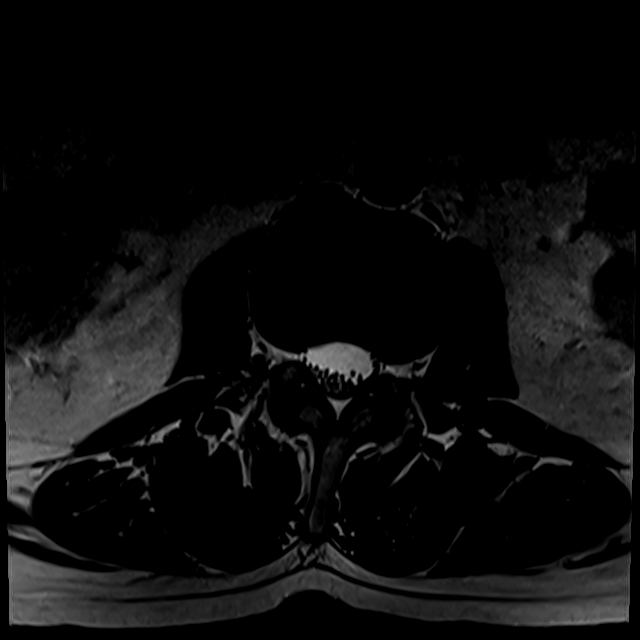

[Series 100: T1 · axial · 4.0mm · 0.28mm/px · z∈[-113,+24]mm · 3 of 36 slices shown (2 of 2)]
[im 6/36]
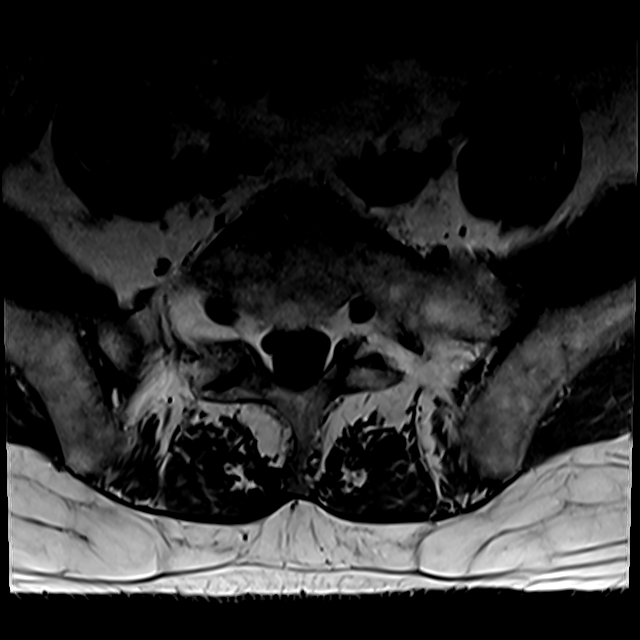
[im 18/36]
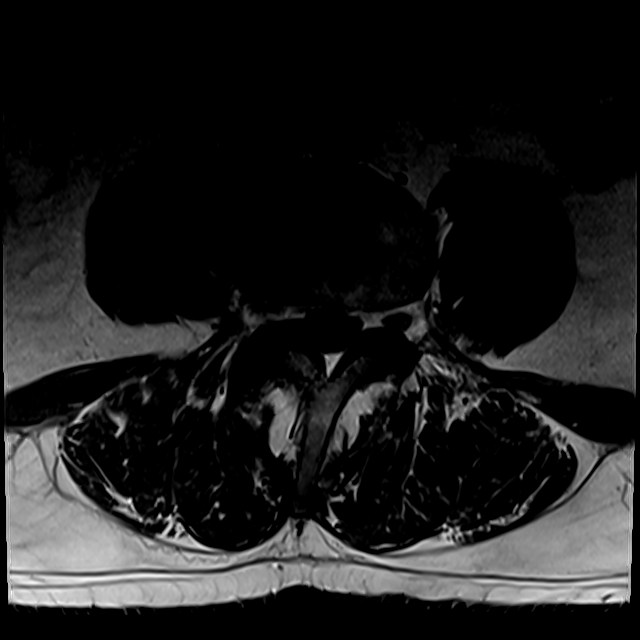
[im 31/36]
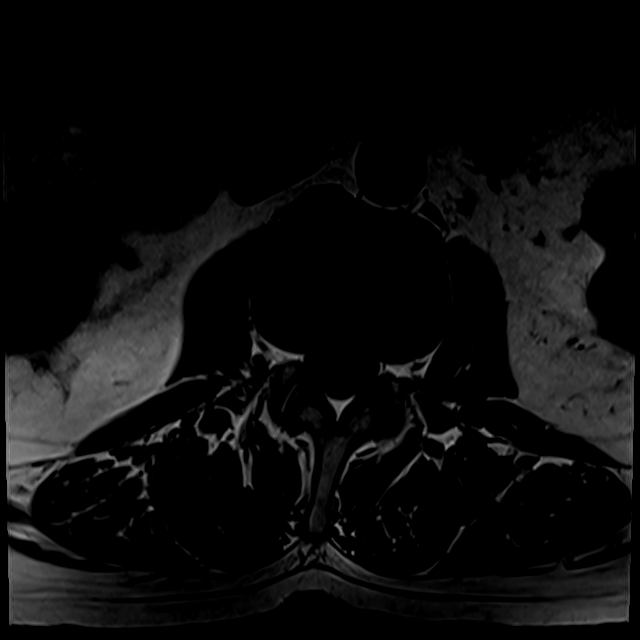

[18 of 48 positions shown; findings below may reference images not displayed]

FINDINGS: Segmentation:  Standard.

Alignment:  2 mm anterolisthesis of L3 on L4.

Vertebrae:  No fracture, evidence of discitis, or bone lesion.

Conus medullaris: Extends to the T12 level and appears normal.

Paraspinal and other soft tissues: Negative

Disc levels:

Disc spaces: Disc desiccation throughout the lumbar spine.

T12-L1: No significant disc bulge. No evidence of neural foraminal
stenosis. No central canal stenosis.

L1-L2: No significant disc bulge. No evidence of neural foraminal
stenosis. No central canal stenosis.

L2-L3: Eccentric right broad-based disc bulge. No evidence of neural
foraminal stenosis. No central canal stenosis.

L3-L4: Mild broad-based disc bulge. Moderate bilateral facet
arthropathy. Moderate spinal stenosis and bilateral lateral recess
stenosis. No evidence of neural foraminal stenosis.

L4-L5: Mild broad-based disc bulge. Moderate left and mild right
facet arthropathy. Severe left foraminal narrowing. No right
foraminal narrowing. Bilateral lateral recess narrowing. No central
canal stenosis.

L5-S1: No significant disc bulge. No evidence of neural foraminal
stenosis. No central canal stenosis.
IMPRESSION: 1. At L3-4 there is a mild broad-based disc bulge. Moderate
bilateral facet arthropathy. Moderate spinal stenosis and bilateral
lateral recess stenosis.
2. At L4-5 there is a mild broad-based disc bulge. Moderate left and
mild right facet arthropathy. Severe left foraminal narrowing. No
right foraminal narrowing. Bilateral lateral recess narrowing.

## 2017-09-14 DIAGNOSIS — H52203 Unspecified astigmatism, bilateral: Secondary | ICD-10-CM | POA: Diagnosis not present

## 2017-09-14 DIAGNOSIS — D3132 Benign neoplasm of left choroid: Secondary | ICD-10-CM | POA: Diagnosis not present

## 2017-09-14 DIAGNOSIS — Z961 Presence of intraocular lens: Secondary | ICD-10-CM | POA: Diagnosis not present

## 2017-10-16 DIAGNOSIS — B349 Viral infection, unspecified: Secondary | ICD-10-CM | POA: Diagnosis not present

## 2017-10-18 DIAGNOSIS — L218 Other seborrheic dermatitis: Secondary | ICD-10-CM | POA: Diagnosis not present

## 2017-10-18 DIAGNOSIS — D225 Melanocytic nevi of trunk: Secondary | ICD-10-CM | POA: Diagnosis not present

## 2017-10-18 DIAGNOSIS — L821 Other seborrheic keratosis: Secondary | ICD-10-CM | POA: Diagnosis not present

## 2017-10-18 DIAGNOSIS — L309 Dermatitis, unspecified: Secondary | ICD-10-CM | POA: Diagnosis not present

## 2017-10-18 DIAGNOSIS — L820 Inflamed seborrheic keratosis: Secondary | ICD-10-CM | POA: Diagnosis not present

## 2017-11-17 DIAGNOSIS — L738 Other specified follicular disorders: Secondary | ICD-10-CM | POA: Diagnosis not present

## 2017-12-04 DIAGNOSIS — E7800 Pure hypercholesterolemia, unspecified: Secondary | ICD-10-CM | POA: Diagnosis not present

## 2017-12-04 DIAGNOSIS — Z7982 Long term (current) use of aspirin: Secondary | ICD-10-CM | POA: Diagnosis not present

## 2017-12-07 DIAGNOSIS — K219 Gastro-esophageal reflux disease without esophagitis: Secondary | ICD-10-CM | POA: Diagnosis not present

## 2017-12-07 DIAGNOSIS — Z1212 Encounter for screening for malignant neoplasm of rectum: Secondary | ICD-10-CM | POA: Diagnosis not present

## 2017-12-07 DIAGNOSIS — K2270 Barrett's esophagus without dysplasia: Secondary | ICD-10-CM | POA: Diagnosis not present

## 2017-12-07 DIAGNOSIS — R292 Abnormal reflex: Secondary | ICD-10-CM | POA: Diagnosis not present

## 2017-12-07 DIAGNOSIS — Z7982 Long term (current) use of aspirin: Secondary | ICD-10-CM | POA: Diagnosis not present

## 2017-12-07 DIAGNOSIS — Z6828 Body mass index (BMI) 28.0-28.9, adult: Secondary | ICD-10-CM | POA: Diagnosis not present

## 2017-12-07 DIAGNOSIS — E7800 Pure hypercholesterolemia, unspecified: Secondary | ICD-10-CM | POA: Diagnosis not present

## 2017-12-07 DIAGNOSIS — J8410 Pulmonary fibrosis, unspecified: Secondary | ICD-10-CM | POA: Diagnosis not present

## 2017-12-07 DIAGNOSIS — I2510 Atherosclerotic heart disease of native coronary artery without angina pectoris: Secondary | ICD-10-CM | POA: Diagnosis not present

## 2017-12-07 DIAGNOSIS — Z0001 Encounter for general adult medical examination with abnormal findings: Secondary | ICD-10-CM | POA: Diagnosis not present

## 2017-12-07 DIAGNOSIS — J309 Allergic rhinitis, unspecified: Secondary | ICD-10-CM | POA: Diagnosis not present

## 2018-01-04 DIAGNOSIS — L2089 Other atopic dermatitis: Secondary | ICD-10-CM | POA: Diagnosis not present

## 2018-01-17 DIAGNOSIS — R972 Elevated prostate specific antigen [PSA]: Secondary | ICD-10-CM | POA: Diagnosis not present

## 2018-01-25 DIAGNOSIS — Z125 Encounter for screening for malignant neoplasm of prostate: Secondary | ICD-10-CM | POA: Diagnosis not present

## 2018-01-25 DIAGNOSIS — F5221 Male erectile disorder: Secondary | ICD-10-CM | POA: Diagnosis not present

## 2018-04-07 ENCOUNTER — Other Ambulatory Visit: Payer: Self-pay

## 2018-04-07 ENCOUNTER — Emergency Department (HOSPITAL_COMMUNITY)
Admission: EM | Admit: 2018-04-07 | Discharge: 2018-04-07 | Disposition: A | Discharge status: Home / Self Care | Payer: PPO | Attending: Emergency Medicine | Admitting: Emergency Medicine

## 2018-04-07 ENCOUNTER — Encounter (HOSPITAL_COMMUNITY): Payer: Self-pay | Admitting: Emergency Medicine

## 2018-04-07 DIAGNOSIS — T63481A Toxic effect of venom of other arthropod, accidental (unintentional), initial encounter: Secondary | ICD-10-CM

## 2018-04-07 DIAGNOSIS — Z7982 Long term (current) use of aspirin: Secondary | ICD-10-CM | POA: Diagnosis not present

## 2018-04-07 DIAGNOSIS — Z79899 Other long term (current) drug therapy: Secondary | ICD-10-CM | POA: Diagnosis not present

## 2018-04-07 DIAGNOSIS — T63461A Toxic effect of venom of wasps, accidental (unintentional), initial encounter: Secondary | ICD-10-CM | POA: Insufficient documentation

## 2018-04-07 DIAGNOSIS — H9202 Otalgia, left ear: Secondary | ICD-10-CM | POA: Diagnosis not present

## 2018-04-07 DIAGNOSIS — Z87891 Personal history of nicotine dependence: Secondary | ICD-10-CM | POA: Diagnosis not present

## 2018-04-07 DIAGNOSIS — R238 Other skin changes: Secondary | ICD-10-CM | POA: Diagnosis not present

## 2018-04-07 MED ORDER — PREDNISONE 10 MG PO TABS: 10.0000 mg | ORAL_TABLET | Freq: Every day | ORAL | 0 refills | Status: AC

## 2018-04-07 NOTE — ED Notes (Signed)
Patient verbalizes understanding of discharge instructions. Opportunity for questioning and answers were provided. Armband removed by staff, pt discharged from ED.  

## 2018-04-07 NOTE — ED Triage Notes (Signed)
Patient complains of bee sting to left ear on Thursday. Reports swelling and itching. No dyspnea. Taking benadryl with minimal improvement to symptoms.

## 2018-04-07 NOTE — ED Provider Notes (Signed)
[facility] EMERGENCY DEPARTMENT Provider Note  CSN: [id number] Arrival date & time: 04/07/18  0708    History   Chief Complaint Chief Complaint  Patient presents with  . Insect Bite    HPI Kenneth Ingram. is a 72 y.o. male who presented to the ED for a wasp sting that occurred 2 days ago. He reports being stung on his left ear and no other sites. He states that his ear has become more swollen and red since then. He has been taking Benadryl since the incident. Denies SOB, other skin lesions/rashes, mouth/lip swelling or any other signs of anaphylaxis after the initial sting or today. He reports no changes in his hearing and denies tinnitus.  Past Medical History:  Diagnosis Date  . Barrett's esophagus   . Cataract    bil cateracts removed 2008  . GERD (gastroesophageal reflux disease)   . Hyperlipidemia   . Sleep apnea    pt does not wear a c-pap  . Vertigo     There are no active problems to display for this patient.   Past Surgical History:  Procedure Laterality Date  . CATARACT EXTRACTION W/ INTRAOCULAR LENS  IMPLANT, BILATERAL  2007  . COLONOSCOPY    . KNEE ARTHROSCOPY  1997   right  . TONSILLECTOMY  1968  . UPPER GASTROINTESTINAL ENDOSCOPY    . WISDOM TOOTH EXTRACTION          Home Medications    Prior to Admission medications   Medication Sig Start Date End Date Taking? Authorizing Provider  aspirin EC 81 MG tablet Take 81 mg by mouth daily.    [provider]  atorvastatin (LIPITOR) 10 MG tablet Take 10 mg by mouth daily. 04/28/16   [provider]  GINKGO BILOBA PO Take 1 tablet by mouth daily.    [provider]  meclizine (ANTIVERT) 25 MG tablet Take 1 tablet (25 mg total) by mouth 3 (three) times daily as needed for dizziness. 06/11/16   Davonna Belling, MD  Multiple Vitamins-Minerals (MULTIVITAMIN WITH MINERALS) tablet Take 1 tablet by mouth daily.    [provider]  pantoprazole (PROTONIX)  40 MG tablet Take 1 tablet (40 mg total) by mouth daily. 05/29/17   Armbruster, Carlota Raspberry, MD  prednisoLONE acetate (PRED FORTE) 1 % ophthalmic suspension Place 1 drop into both eyes 4 (four) times daily as needed for dry eyes. 03/25/16   [provider]  predniSONE (DELTASONE) 10 MG tablet Take 1 tablet (10 mg total) by mouth daily for 10 doses. Day 1: take 40mg , Day 2: take 30mg , Day 3: take 20mg , Day 4: take 10mg  04/07/18 04/17/18  , Alvie Heidelberg I, PA-C  Protein POWD Take 1 scoop by mouth daily. In the morning for breakfast.    [provider]    Family History Family History  Problem Relation Age of Onset  . Esophageal cancer Mother 19       died soon after dx  . Colon cancer Neg Hx   . Pancreatic cancer Neg Hx   . Prostate cancer Neg Hx   . Rectal cancer Neg Hx   . Stomach cancer Neg Hx     Social History Social History   Tobacco Use  . Smoking status: Former Smoker    Types: Cigarettes  . Smokeless tobacco: Never Used  Substance Use Topics  . Alcohol use: Yes    Alcohol/week: 2.4 oz    Types: 4 Glasses of wine per week  .  Drug use: No     Allergies   Patient has no known allergies.   Review of Systems Review of Systems  Constitutional: Negative.   HENT: Positive for ear pain. Negative for ear discharge and facial swelling.   Eyes: Negative.   Respiratory: Negative for cough and shortness of breath.   Cardiovascular: Negative for chest pain.  Skin: Negative for rash.       Ear redness. Sting on left ear.  Neurological: Negative for numbness and headaches.     Physical Exam Updated Vital Signs BP 139/82 (BP Location: Right Arm)   Pulse 71   Temp 98.1 F (36.7 C) (Oral)   Resp 18   Ht 6' (1.829 m)   Wt 90.7 kg (200 lb)   SpO2 99%   BMI 27.12 kg/m   Physical Exam  Constitutional: Vital signs are normal. He appears well-developed and well-nourished.  HENT:  Head: Normocephalic and atraumatic.  Right Ear: Hearing, tympanic membrane,  external ear and ear canal normal.  Left Ear: Hearing, tympanic membrane and ear canal normal. No foreign bodies.  Left auricle edematous and mildly erythematous when compared to the right. No abnormalities with left external canal or TM.  Neurological: No cranial nerve deficit.  Skin: Skin is warm and intact. No rash noted.  Nursing note and vitals reviewed.    ED Treatments / Results  Labs (all labs ordered are listed, but only abnormal results are displayed) Labs Reviewed - No data to display  EKG None  Radiology No results found.  Procedures Procedures (including critical care time)  Medications Ordered in ED Medications - No data to display   Initial Impression / Assessment and Plan / ED Course  Triage vital signs and the nursing notes have been reviewed.  Pertinent labs & imaging results that were available during care of the patient were reviewed and considered in medical decision making (see chart for details).   Patient presents to the ED for a wasp sting on his left ear. He reports increased swelling of his ear since he was stung 2 days ago. Fortunately, at the time of the incident he did not experience anaphylaxis. Today other than the swollen ear appreciated on physical exam there are no HENT, dermatologic or respiratory abnormalities or signs of distress. No signs of cellulitis where he was stung. He would benefit from steroid to assist with inflammation. Patient preferred PO vs IM today.  Final Clinical Impressions(s) / ED Diagnoses  1. Insect Sting Reaction. Steroid taper prescribed for acute resolution of swelling. Education provided on anaphylaxis and reasons to return to the ED or medical provider.  Dispo: Home. After thorough clinical evaluation, this patient is determined to be medically stable and can be safely discharged with the previously mentioned treatment and/or outpatient follow-up/referral(s). At this time, there are no other apparent medical  conditions that require further screening, evaluation or treatment.   Final diagnoses:  Allergic reaction to insect sting, accidental or unintentional, initial encounter    ED Discharge Orders        Ordered    predniSONE (DELTASONE) 10 MG tablet  Daily     04/07/18 0828        Romie Jumper, PA-C 04/07/18 0915    Isla Pence, MD 04/07/18 [phone]

## 2018-04-07 NOTE — Discharge Instructions (Signed)
You will take this steroid taper for 4 days to help with the swelling around your ear. You may use ice on the area to help with swelling as well. Tylenol is fine to use for pain.  Follow-up with a medical provider if the swelling does not improve after steroids or gets worse.  Stay cool and safe in this hot weather! Leave the innocent wasps alone.

## 2018-04-09 ENCOUNTER — Other Ambulatory Visit: Payer: Self-pay

## 2018-05-31 DIAGNOSIS — J40 Bronchitis, not specified as acute or chronic: Secondary | ICD-10-CM | POA: Diagnosis not present

## 2018-05-31 DIAGNOSIS — R05 Cough: Secondary | ICD-10-CM | POA: Diagnosis not present

## 2018-05-31 DIAGNOSIS — I25111 Atherosclerotic heart disease of native coronary artery with angina pectoris with documented spasm: Secondary | ICD-10-CM | POA: Diagnosis not present

## 2018-05-31 DIAGNOSIS — Z23 Encounter for immunization: Secondary | ICD-10-CM | POA: Diagnosis not present

## 2018-05-31 DIAGNOSIS — Z87891 Personal history of nicotine dependence: Secondary | ICD-10-CM | POA: Diagnosis not present

## 2018-05-31 DIAGNOSIS — J8410 Pulmonary fibrosis, unspecified: Secondary | ICD-10-CM | POA: Diagnosis not present

## 2018-06-05 ENCOUNTER — Other Ambulatory Visit: Payer: Self-pay | Admitting: Internal Medicine

## 2018-06-05 DIAGNOSIS — J8410 Pulmonary fibrosis, unspecified: Secondary | ICD-10-CM

## 2018-06-06 ENCOUNTER — Ambulatory Visit
Admission: RE | Admit: 2018-06-06 | Discharge: 2018-06-06 | Disposition: A | Discharge status: Home / Self Care | Payer: PPO | Source: Ambulatory Visit | Attending: Internal Medicine | Admitting: Internal Medicine

## 2018-06-06 DIAGNOSIS — J8410 Pulmonary fibrosis, unspecified: Secondary | ICD-10-CM

## 2018-06-06 DIAGNOSIS — R918 Other nonspecific abnormal finding of lung field: Secondary | ICD-10-CM | POA: Diagnosis not present

## 2018-06-21 ENCOUNTER — Other Ambulatory Visit: Payer: Self-pay | Admitting: Gastroenterology

## 2018-06-21 DIAGNOSIS — K219 Gastro-esophageal reflux disease without esophagitis: Secondary | ICD-10-CM

## 2018-06-21 DIAGNOSIS — Z1211 Encounter for screening for malignant neoplasm of colon: Secondary | ICD-10-CM

## 2018-06-21 DIAGNOSIS — Z800 Family history of malignant neoplasm of digestive organs: Secondary | ICD-10-CM

## 2018-06-22 ENCOUNTER — Encounter: Payer: Self-pay | Admitting: Cardiovascular Disease

## 2018-06-22 ENCOUNTER — Ambulatory Visit (INDEPENDENT_AMBULATORY_CARE_PROVIDER_SITE_OTHER): Payer: PPO | Admitting: Cardiovascular Disease

## 2018-06-22 VITALS — BP 119/77 | HR 73 | Ht 72.0 in | Wt 201.0 lb

## 2018-06-22 DIAGNOSIS — I2510 Atherosclerotic heart disease of native coronary artery without angina pectoris: Secondary | ICD-10-CM | POA: Diagnosis not present

## 2018-06-22 DIAGNOSIS — E785 Hyperlipidemia, unspecified: Secondary | ICD-10-CM | POA: Insufficient documentation

## 2018-06-22 DIAGNOSIS — E7800 Pure hypercholesterolemia, unspecified: Secondary | ICD-10-CM | POA: Diagnosis not present

## 2018-06-22 NOTE — Progress Notes (Signed)
06/22/2018 Kenneth Ingram   1946/06/23  [id number]  Primary Physician Deland Pretty, MD Primary Cardiologist: Lorretta Harp MD [name], [place]  HPI:  Kenneth Ingram. is a 72 y.o. fit appearing married Caucasian male father of 2 step sons referred by Dr. Deland Pretty for cardiovascular evaluation because of coronary calcification seen on chest CT.  He has a remote history tobacco abuse having smoked 30 pack years and quit in 1996.  History of hyperlipidemia.  There is no family history for heart disease.  He denies chest pain or shortness of breath.  He works out 5 to 6 days a week in the gym without limitation.  He recently had a chest CT performed 06/06/2018 that showed coronary calcification.   Current Meds  Medication Sig  . aspirin EC 81 MG tablet Take 81 mg by mouth daily.  Marland Kitchen atorvastatin (LIPITOR) 10 MG tablet Take 10 mg by mouth daily.  Marland Kitchen GINKGO BILOBA PO Take 1 tablet by mouth daily.  . meclizine (ANTIVERT) 25 MG tablet Take 1 tablet (25 mg total) by mouth 3 (three) times daily as needed for dizziness.  . Multiple Vitamins-Minerals (MULTIVITAMIN WITH MINERALS) tablet Take 1 tablet by mouth daily.  . pantoprazole (PROTONIX) 40 MG tablet TAKE ONE TABLET BY MOUTH DAILY  . Polyethyl Glycol-Propyl Glycol (SYSTANE FREE OP) Apply 1 drop to eye as directed.     No Known Allergies  Social History   Socioeconomic History  . Marital status: Married    Spouse name: Not on file  . Number of children: Not on file  . Years of education: Not on file  . Highest education level: Not on file  Occupational History  . Not on file  Social Needs  . Financial resource strain: Not on file  . Food insecurity:    Worry: Not on file    Inability: Not on file  . Transportation needs:    Medical: Not on file    Non-medical: Not on file  Tobacco Use  . Smoking status: Former Smoker    Types: Cigarettes  . Smokeless tobacco: Never Used  Substance and Sexual Activity  .  Alcohol use: Yes    Alcohol/week: 4.0 standard drinks    Types: 4 Glasses of wine per week  . Drug use: No  . Sexual activity: Not on file  Lifestyle  . Physical activity:    Days per week: Not on file    Minutes per session: Not on file  . Stress: Not on file  Relationships  . Social connections:    Talks on phone: Not on file    Gets together: Not on file    Attends religious service: Not on file    Active member of club or organization: Not on file    Attends meetings of clubs or organizations: Not on file    Relationship status: Not on file  . Intimate partner violence:    Fear of current or ex partner: Not on file    Emotionally abused: Not on file    Physically abused: Not on file    Forced sexual activity: Not on file  Other Topics Concern  . Not on file  Social History Narrative  . Not on file     Review of Systems: General: negative for chills, fever, night sweats or weight changes.  Cardiovascular: negative for chest pain, dyspnea on exertion, edema, orthopnea, palpitations, paroxysmal nocturnal dyspnea or shortness of breath Dermatological: negative  for rash Respiratory: negative for cough or wheezing Urologic: negative for hematuria Abdominal: negative for nausea, vomiting, diarrhea, bright red blood per rectum, melena, or hematemesis Neurologic: negative for visual changes, syncope, or dizziness All other systems reviewed and are otherwise negative except as noted above.    Blood pressure 119/77, pulse 73, height 6' (1.829 m), weight 201 lb (91.2 kg), SpO2 97 %.  General appearance: alert and no distress Neck: no adenopathy, no carotid bruit, no JVD, supple, symmetrical, trachea midline and thyroid not enlarged, symmetric, no tenderness/mass/nodules Lungs: clear to auscultation bilaterally Heart: regular rate and rhythm, S1, S2 normal, no murmur, click, rub or gallop Extremities: extremities normal, atraumatic, no cyanosis or edema Pulses: 2+ and  symmetric Skin: Skin color, texture, turgor normal. No rashes or lesions Neurologic: Alert and oriented X 3, normal strength and tone. Normal symmetric reflexes. Normal coordination and gait  EKG not performed today  ASSESSMENT AND PLAN:   Hyperlipidemia History of hyperlipidemia on statin therapy with lipid profile performed 12/04/2017 revealing total cholesterol 132, LDL 61 and HDL of 54.  Coronary artery calcification seen on CT scan Circadian coronary calcification seen on chest CT 06/06/2018.  He is totally asymptomatic.  I am going to get a exercise Myoview stress test to rule out obstructive disease.      Lorretta Harp MD FACP,FACC,FAHA, [facility] 06/22/2018 11:42 AM

## 2018-06-22 NOTE — Assessment & Plan Note (Signed)
History of hyperlipidemia on statin therapy with lipid profile performed 12/04/2017 revealing total cholesterol 132, LDL 61 and HDL of 54.

## 2018-06-22 NOTE — Assessment & Plan Note (Signed)
Circadian coronary calcification seen on chest CT 06/06/2018.  He is totally asymptomatic.  I am going to get a exercise Myoview stress test to rule out obstructive disease.

## 2018-06-22 NOTE — Patient Instructions (Signed)
Medication Instructions:  Your physician recommends that you continue on your current medications as directed. Please refer to the Current Medication list given to you today.  Labwork: none  Testing/Procedures: Your physician has requested that you have en exercise stress myoview. For further information please visit [email or web address]. Please follow instruction sheet, as given.    Follow-Up: Follow up with Dr. Gwenlyn Found as needed.   Any Other Special Instructions Will Be Listed Below (If Applicable).     If you need a refill on your cardiac medications before your next appointment, please call your pharmacy.

## 2018-06-26 ENCOUNTER — Telehealth (HOSPITAL_COMMUNITY): Payer: Self-pay

## 2018-06-26 NOTE — Telephone Encounter (Signed)
Encounter complete. 

## 2018-06-28 ENCOUNTER — Ambulatory Visit (HOSPITAL_COMMUNITY)
Admission: RE | Admit: 2018-06-28 | Discharge: 2018-06-28 | Disposition: A | Discharge status: Home / Self Care | Payer: PPO | Source: Ambulatory Visit | Attending: Internal Medicine | Admitting: Internal Medicine

## 2018-06-28 DIAGNOSIS — I2510 Atherosclerotic heart disease of native coronary artery without angina pectoris: Secondary | ICD-10-CM | POA: Diagnosis not present

## 2018-06-28 LAB — MYOCARDIAL PERFUSION IMAGING
CHL CUP NUCLEAR SDS: 1
CHL CUP NUCLEAR SRS: 0
CHL CUP NUCLEAR SSS: 1
CSEPEDS: 0 s
CSEPEW: 10.1 METS
CSEPPHR: 137 {beats}/min
Exercise duration (min): 9 min
LV dias vol: 95 mL (ref 62–150)
LVSYSVOL: 44 mL
MPHR: 150 {beats}/min
Percent HR: 91 %
RPE: 17
Rest HR: 58 {beats}/min
TID: 1

## 2018-06-28 MED ORDER — TECHNETIUM TC 99M TETROFOSMIN IV KIT
28.5000 | PACK | Freq: Once | INTRAVENOUS | Status: AC | PRN
  Administered 2018-06-28: 28.5 via INTRAVENOUS
  Filled 2018-06-28: qty 29

## 2018-06-28 MED ORDER — TECHNETIUM TC 99M TETROFOSMIN IV KIT
9.9000 | PACK | Freq: Once | INTRAVENOUS | Status: AC | PRN
  Administered 2018-06-28: 9.9 via INTRAVENOUS
  Filled 2018-06-28: qty 10

## 2018-06-29 ENCOUNTER — Encounter: Payer: Self-pay | Admitting: *Deleted

## 2018-07-19 DIAGNOSIS — L239 Allergic contact dermatitis, unspecified cause: Secondary | ICD-10-CM | POA: Diagnosis not present

## 2018-07-19 DIAGNOSIS — W57XXXA Bitten or stung by nonvenomous insect and other nonvenomous arthropods, initial encounter: Secondary | ICD-10-CM | POA: Diagnosis not present

## 2018-10-10 DIAGNOSIS — K30 Functional dyspepsia: Secondary | ICD-10-CM | POA: Diagnosis not present

## 2018-10-10 DIAGNOSIS — K219 Gastro-esophageal reflux disease without esophagitis: Secondary | ICD-10-CM | POA: Diagnosis not present

## 2018-10-30 DIAGNOSIS — L249 Irritant contact dermatitis, unspecified cause: Secondary | ICD-10-CM | POA: Diagnosis not present

## 2018-10-30 DIAGNOSIS — L2089 Other atopic dermatitis: Secondary | ICD-10-CM | POA: Diagnosis not present

## 2019-01-21 DIAGNOSIS — R972 Elevated prostate specific antigen [PSA]: Secondary | ICD-10-CM | POA: Diagnosis not present

## 2019-01-28 DIAGNOSIS — N5201 Erectile dysfunction due to arterial insufficiency: Secondary | ICD-10-CM | POA: Diagnosis not present

## 2019-02-18 DIAGNOSIS — M25561 Pain in right knee: Secondary | ICD-10-CM | POA: Diagnosis not present

## 2019-02-18 DIAGNOSIS — M25562 Pain in left knee: Secondary | ICD-10-CM | POA: Diagnosis not present

## 2019-02-27 ENCOUNTER — Telehealth: Payer: Self-pay | Admitting: Cardiovascular Disease

## 2019-02-27 NOTE — Telephone Encounter (Signed)
New Message    Patient states his heart was racing this morning and woke him up out of his sleep.  No other issue such as chest pain or sob he states his heart was beating really fast.

## 2019-02-27 NOTE — Telephone Encounter (Signed)
Return call to pt. He report this morning around 3 am, he felt like his heart was racing and it woke him up out his sleep. He report it lasted about 1 min and he didn't have any other symptoms. Pt report he feels fine right now but is concerned as said it just didn't feel normal. Will route to MD for recommendations.

## 2019-02-27 NOTE — Telephone Encounter (Signed)
If episodes like this happen again we will get an event monitor and I will see him back after that.

## 2019-03-01 DIAGNOSIS — M25562 Pain in left knee: Secondary | ICD-10-CM | POA: Diagnosis not present

## 2019-03-01 DIAGNOSIS — M25561 Pain in right knee: Secondary | ICD-10-CM | POA: Diagnosis not present

## 2019-03-05 NOTE — Telephone Encounter (Signed)
lmtcb to discuss Dr. Kennon Holter recommendations

## 2019-03-06 DIAGNOSIS — Z0000 Encounter for general adult medical examination without abnormal findings: Secondary | ICD-10-CM | POA: Diagnosis not present

## 2019-03-06 DIAGNOSIS — E7800 Pure hypercholesterolemia, unspecified: Secondary | ICD-10-CM | POA: Diagnosis not present

## 2019-03-06 DIAGNOSIS — R7303 Prediabetes: Secondary | ICD-10-CM | POA: Diagnosis not present

## 2019-03-13 DIAGNOSIS — J309 Allergic rhinitis, unspecified: Secondary | ICD-10-CM | POA: Diagnosis not present

## 2019-03-13 DIAGNOSIS — G2581 Restless legs syndrome: Secondary | ICD-10-CM | POA: Diagnosis not present

## 2019-03-13 DIAGNOSIS — D72819 Decreased white blood cell count, unspecified: Secondary | ICD-10-CM | POA: Diagnosis not present

## 2019-03-13 DIAGNOSIS — Z23 Encounter for immunization: Secondary | ICD-10-CM | POA: Diagnosis not present

## 2019-03-13 DIAGNOSIS — R002 Palpitations: Secondary | ICD-10-CM | POA: Diagnosis not present

## 2019-03-13 DIAGNOSIS — F17210 Nicotine dependence, cigarettes, uncomplicated: Secondary | ICD-10-CM | POA: Diagnosis not present

## 2019-03-13 DIAGNOSIS — Z87891 Personal history of nicotine dependence: Secondary | ICD-10-CM | POA: Diagnosis not present

## 2019-03-13 DIAGNOSIS — G4733 Obstructive sleep apnea (adult) (pediatric): Secondary | ICD-10-CM | POA: Diagnosis not present

## 2019-03-13 DIAGNOSIS — I2510 Atherosclerotic heart disease of native coronary artery without angina pectoris: Secondary | ICD-10-CM | POA: Diagnosis not present

## 2019-03-13 DIAGNOSIS — K2270 Barrett's esophagus without dysplasia: Secondary | ICD-10-CM | POA: Diagnosis not present

## 2019-03-13 DIAGNOSIS — R5383 Other fatigue: Secondary | ICD-10-CM | POA: Diagnosis not present

## 2019-03-13 DIAGNOSIS — Z0000 Encounter for general adult medical examination without abnormal findings: Secondary | ICD-10-CM | POA: Diagnosis not present

## 2019-03-15 DIAGNOSIS — M25562 Pain in left knee: Secondary | ICD-10-CM | POA: Diagnosis not present

## 2019-03-15 DIAGNOSIS — M25561 Pain in right knee: Secondary | ICD-10-CM | POA: Diagnosis not present

## 2019-03-26 ENCOUNTER — Other Ambulatory Visit: Payer: Self-pay | Admitting: Gastroenterology

## 2019-03-26 DIAGNOSIS — K219 Gastro-esophageal reflux disease without esophagitis: Secondary | ICD-10-CM

## 2019-03-26 DIAGNOSIS — Z1211 Encounter for screening for malignant neoplasm of colon: Secondary | ICD-10-CM

## 2019-03-26 DIAGNOSIS — Z800 Family history of malignant neoplasm of digestive organs: Secondary | ICD-10-CM

## 2019-03-28 ENCOUNTER — Encounter: Payer: Self-pay | Admitting: Cardiology

## 2019-03-28 NOTE — Telephone Encounter (Signed)
Left message to call back  

## 2019-03-29 ENCOUNTER — Other Ambulatory Visit: Payer: Self-pay

## 2019-03-29 ENCOUNTER — Ambulatory Visit: Payer: PPO | Admitting: Cardiology

## 2019-03-29 ENCOUNTER — Encounter: Payer: Self-pay | Admitting: Cardiology

## 2019-03-29 VITALS — BP 123/64 | HR 77 | Ht 71.0 in | Wt 193.0 lb

## 2019-03-29 DIAGNOSIS — M25562 Pain in left knee: Secondary | ICD-10-CM | POA: Diagnosis not present

## 2019-03-29 DIAGNOSIS — G4733 Obstructive sleep apnea (adult) (pediatric): Secondary | ICD-10-CM

## 2019-03-29 DIAGNOSIS — I2510 Atherosclerotic heart disease of native coronary artery without angina pectoris: Secondary | ICD-10-CM

## 2019-03-29 DIAGNOSIS — Q676 Pectus excavatum: Secondary | ICD-10-CM | POA: Diagnosis not present

## 2019-03-29 DIAGNOSIS — Z87891 Personal history of nicotine dependence: Secondary | ICD-10-CM | POA: Diagnosis not present

## 2019-03-29 DIAGNOSIS — I340 Nonrheumatic mitral (valve) insufficiency: Secondary | ICD-10-CM

## 2019-03-29 DIAGNOSIS — E7800 Pure hypercholesterolemia, unspecified: Secondary | ICD-10-CM

## 2019-03-29 DIAGNOSIS — R002 Palpitations: Secondary | ICD-10-CM | POA: Diagnosis not present

## 2019-03-29 DIAGNOSIS — M25561 Pain in right knee: Secondary | ICD-10-CM | POA: Diagnosis not present

## 2019-03-29 HISTORY — DX: Palpitations: R00.2

## 2019-03-29 NOTE — Progress Notes (Signed)
Primary Physician:  Deland Pretty, MD   Patient ID: Kenneth Ingram., male    DOB: 03-Apr-1946, 73 y.o.   MRN: [id number]  Subjective:    Chief Complaint  Patient presents with  . Palpitations  . New Patient (Initial Visit)    HPI: Kenneth Ingram.  is a 73 y.o. male  with hyperlipidemia, OSA not on CPAP, former tobacco use, coronary calcification on CT scan previously seen by Dr. Gwenlyn Found and underwent exercise nuclear stress test in Oct 2019 that was considered low risk study, per patient preference, referred to [place] by Dr. Shelia Media for evaluation of palpitations.   Palpitations started in early June and awoken him from sleep. States felt like pulsation under his left ribs. He had previously felt related to stress; however, he is now retired and not under significant stress. He has not had any recurrent symptoms for the last 4 weeks. He has worn monitor that has just recently completed a monitor. He has had some fatigue over the last few months, but was recently found to have hypothyroidism and is now on Synthroid. Fatigue has improved, but not completely resolved.   Patient denies any history of hypertension. States hyperlipidemia is well controlled. He denies any chest pain, shortness of breath, leg swelling, PND or orthopnea, or symptoms of claudication or TIA. He was previously told to have sleep apnea; however, did not want to wear CPAP. States that he doesn't feel that he needs this. He is active with working out with a trainer 5 days a week at the gym that he tolerates well.   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 He continues to have back pain and knee pain related to this. He is a former smoker that quit in the late 90's.   Past Medical History:  Diagnosis Date  . Barrett's esophagus   . Cataract    bil cateracts removed 2008  . GERD (gastroesophageal reflux  disease)   . Hyperlipidemia   . Palpitations 03/29/2019  . Sleep apnea    pt does not wear a c-pap  . Vertigo     Past Surgical History:  Procedure Laterality Date  . CATARACT EXTRACTION W/ INTRAOCULAR LENS  IMPLANT, BILATERAL  2007  . COLONOSCOPY    . KNEE ARTHROSCOPY  1997   right  . TONSILLECTOMY  1968  . UPPER GASTROINTESTINAL ENDOSCOPY    . WISDOM TOOTH EXTRACTION      Social History   Socioeconomic History  . Marital status: Married    Spouse name: Not on file  . Number of children: 2  . Years of education: Not on file  . Highest education level: Not on file  Occupational History  . Not on file  Social Needs  . Financial resource strain: Not on file  . Food insecurity    Worry: Not on file    Inability: Not on file  . Transportation needs    Medical: Not on file    Non-medical: Not on file  Tobacco Use  . Smoking status: Former Smoker    Packs/day: 1.00    Years: 20.00    Pack years: 20.00    Types: Cigarettes    Quit date: 1996    Years since quitting: 24.5  . Smokeless tobacco: Never Used  Substance and Sexual Activity  . Alcohol use: Yes    Alcohol/week: 4.0 standard  drinks    Types: 4 Glasses of wine per week    Comment: DAILY  . Drug use: No  . Sexual activity: Not on file  Lifestyle  . Physical activity    Days per week: Not on file    Minutes per session: Not on file  . Stress: Not on file  Relationships  . Social Herbalist on phone: Not on file    Gets together: Not on file    Attends religious service: Not on file    Active member of club or organization: Not on file    Attends meetings of clubs or organizations: Not on file    Relationship status: Not on file  . Intimate partner violence    Fear of current or ex partner: Not on file    Emotionally abused: Not on file    Physically abused: Not on file    Forced sexual activity: Not on file  Other Topics Concern  . Not on file  Social History Narrative  . Not on file     Review of Systems  Constitution: Positive for malaise/fatigue. Negative for decreased appetite, weight gain and weight loss.  Eyes: Negative for visual disturbance.  Cardiovascular: Positive for palpitations. Negative for chest pain, claudication, dyspnea on exertion, leg swelling, orthopnea and syncope.  Respiratory: Negative for hemoptysis and wheezing.   Endocrine: Negative for cold intolerance and heat intolerance.  Hematologic/Lymphatic: Does not bruise/bleed easily.  Skin: Negative for nail changes.  Musculoskeletal: Positive for back pain and joint pain (left knee). Negative for muscle weakness and myalgias.  Gastrointestinal: Negative for abdominal pain, change in bowel habit, nausea and vomiting.  Neurological: Negative for difficulty with concentration, dizziness, focal weakness and headaches.  Psychiatric/Behavioral: Negative for altered mental status and suicidal ideas.  All other systems reviewed and are negative.     Objective:  Blood pressure 123/64, pulse 77, height 5\' 11"  (1.803 m), weight 193 lb (87.5 kg), SpO2 97 %. Body mass index is 26.92 kg/m.    Physical Exam  Constitutional: He is oriented to person, place, and time. Vital signs are normal. He appears well-developed and well-nourished.  HENT:  Head: Normocephalic and atraumatic.  Neck: Normal range of motion.  Cardiovascular: Normal rate, regular rhythm and intact distal pulses. Exam reveals a midsystolic click.  Murmur heard. High-pitched blowing holosystolic murmur is present with a grade of 1/6 at the apex. Pulmonary/Chest: Effort normal and breath sounds normal. No accessory muscle usage. No respiratory distress.  Pectus excavatum  Abdominal: Soft. Bowel sounds are normal.  Musculoskeletal: Normal range of motion.  Neurological: He is alert and oriented to person, place, and time.  Skin: Skin is warm and dry.  Vitals reviewed.  Radiology:  CT of chest 06/06/2018:  1. Tiny small pulmonary nodules  are unchanged when compared with previous exam measuring up to 4 mm. These are compatible with a benign process and require no further follow-up. Stable calcified right upper lobe granuloma. 2.  Aortic Atherosclerosis (ICD10-I70.0). 3. Multi vessel coronary artery atherosclerotic calcifications. 4. Hepatic steatosis.  Laboratory examination:    CMP Latest Ref Rng & Units 06/11/2016  Glucose 65 - 99 mg/dL 116(H)  BUN 6 - 20 mg/dL 14  Creatinine 0.61 - 1.24 mg/dL 0.90  Sodium 135 - 145 mmol/L 138  Potassium 3.5 - 5.1 mmol/L 3.4(L)  Chloride 101 - 111 mmol/L 106  CO2 22 - 32 mmol/L 23  Calcium 8.9 - 10.3 mg/dL 8.9   CBC Latest Ref  Rng & Units 06/11/2016  WBC 4.0 - 10.5 K/uL 6.1  Hemoglobin 13.0 - 17.0 g/dL 13.9  Hematocrit 39.0 - 52.0 % 39.2  Platelets 150 - 400 K/uL 207   Lipid Panel  No results found for: CHOL, TRIG, HDL, CHOLHDL, VLDL, LDLCALC, LDLDIRECT HEMOGLOBIN A1C No results found for: HGBA1C, MPG TSH No results for input(s): TSH in the last 8760 hours.  PRN Meds:. Medications Discontinued During This Encounter  Medication Reason  . GINKGO BILOBA PO    Current Meds  Medication Sig  . aspirin EC 81 MG tablet Take 81 mg by mouth daily.  Marland Kitchen atorvastatin (LIPITOR) 10 MG tablet Take 10 mg by mouth daily.  Marland Kitchen levothyroxine (SYNTHROID) 25 MCG tablet   . meclizine (ANTIVERT) 25 MG tablet Take 1 tablet (25 mg total) by mouth 3 (three) times daily as needed for dizziness.  . Multiple Vitamins-Minerals (MULTIVITAMIN WITH MINERALS) tablet Take 1 tablet by mouth daily.  . pantoprazole (PROTONIX) 40 MG tablet Take 1 tablet (40 mg total) by mouth daily. Please schedule a virtual visit with Dr. Havery Moros for refills: [phone]  . Polyethyl Glycol-Propyl Glycol (SYSTANE FREE OP) Apply 1 drop to eye as directed.    Cardiac Studies:   Exercise nuclear stress test 06/28/2018:   The left ventricular ejection fraction is mildly decreased (45-54%).  Nuclear stress EF: 53%.  There  was no ST segment deviation noted during stress.  Blood pressure demonstrated a normal response to exercise.  No T wave inversion was noted during stress.  The study is normal. This is a low risk study.  Assessment:     ICD-10-CM   1. Palpitations  R00.2 EKG 12-Lead    PCV ECHOCARDIOGRAM COMPLETE  2. Coronary artery calcification seen on CT scan  I25.10   3. Pure hypercholesterolemia  E78.00   4. Former tobacco use  Z87.891   5. Obstructive sleep apnea  G47.33   6. Mitral murmur  I34.0   7. Pectus excavatum  Q67.6     EKG 03/29/2019: Normal sinus rhythm at 72 bpm, left atrial enlargement, normal axis, no evidence of ischemia.    Recommendations:   Patient is a pleasant 74 year old male with controlled hyperlipidemia, coronary calcification on CT scan, it was a not on CPAP due to patient preference, former tobacco use, recent diagnosis of hypothyroidism referred to [place] for evaluation of palpitations.  Patient's symptoms are consistent with likely PVCs.  He did wear event monitor with PCP, we'll request for records; however, patient did not have symptoms while wearing the monitor and is likely unyielding.  Episodes are sporadic and only occasional.  He does have risk factors for arrhythmia given untreated sleep apnea.  Would recommend continued watchful waiting.  He was previously evaluated by Dr. Gwenlyn Found and underwent negative nuclear stress testing in October 2019.  Due to patient preference, he had requested to establish here for evaluation of his palpitations.  Coronary calcification on CT scan was present in 2016 and was started on statin therapy at that time.  His lipids are well controlled, would recommend continuing the same.  I discussed that his previous tobacco use is likely etiology for coronary calcification and as he is asymptomatic, do not feel he needs further evaluation.  Patient has pectus excavatum on physical exam along with findings suggestive of mitral valve prolapse  with MR.  Given his palpitations and exam findings, would recommend echocardiogram to exclude any structural abnormalities.  He does have fatigue that is improving with treatment of hypothyroidism.  Suspect fatigue may also be related to underlying sleep apnea.  He may benefit from reevaluation for sleep apnea.  I'll see him back in 6 weeks to discuss echocardiogram results and follow-up on his symptoms.   *I have discussed this case with Dr. Einar Gip and he personally examined the patient and participated in formulating the plan.*   Miquel Dunn, MSN, APRN, FNP-C [facility]. [place] Office: [phone] Fax: [phone]

## 2019-03-31 ENCOUNTER — Encounter: Payer: Self-pay | Admitting: Cardiology

## 2019-04-19 DIAGNOSIS — M25562 Pain in left knee: Secondary | ICD-10-CM | POA: Diagnosis not present

## 2019-04-19 DIAGNOSIS — M25561 Pain in right knee: Secondary | ICD-10-CM | POA: Diagnosis not present

## 2019-04-24 ENCOUNTER — Other Ambulatory Visit: Payer: Self-pay

## 2019-04-24 ENCOUNTER — Ambulatory Visit (INDEPENDENT_AMBULATORY_CARE_PROVIDER_SITE_OTHER): Payer: PPO

## 2019-04-24 DIAGNOSIS — R002 Palpitations: Secondary | ICD-10-CM | POA: Diagnosis not present

## 2019-04-24 DIAGNOSIS — Z0189 Encounter for other specified special examinations: Secondary | ICD-10-CM

## 2019-04-29 NOTE — Progress Notes (Signed)
Pt aware.

## 2019-04-30 ENCOUNTER — Ambulatory Visit (HOSPITAL_COMMUNITY)
Admission: EM | Admit: 2019-04-30 | Discharge: 2019-04-30 | Disposition: A | Discharge status: Home / Self Care | Payer: PPO | Attending: Family Medicine | Admitting: Family Medicine

## 2019-04-30 ENCOUNTER — Other Ambulatory Visit: Payer: Self-pay

## 2019-04-30 ENCOUNTER — Encounter (HOSPITAL_COMMUNITY): Payer: Self-pay | Admitting: Emergency Medicine

## 2019-04-30 DIAGNOSIS — R4589 Other symptoms and signs involving emotional state: Secondary | ICD-10-CM | POA: Diagnosis not present

## 2019-04-30 DIAGNOSIS — R4582 Worries: Secondary | ICD-10-CM

## 2019-04-30 DIAGNOSIS — Z20828 Contact with and (suspected) exposure to other viral communicable diseases: Secondary | ICD-10-CM | POA: Insufficient documentation

## 2019-04-30 NOTE — ED Provider Notes (Signed)
[place]   [id number] 04/30/19 Arrival Time: [place]:  1. Feeling worried     COVID-19 testing sent. Will quarantine at home until results available.  Follow-up Information    [facility].   Specialty: Urgent Care Why: As needed. Contact information: [place] [phone]          Reviewed expectations re: course of current medical issues. Questions answered. Outlined signs and symptoms indicating need for more acute intervention. Patient verbalized understanding. After Visit Summary given.   SUBJECTIVE: History from: patient.  Kenneth Ingram. is a 73 y.o. male who who reports worry over COVID-19. Has been out in public more than usual. No symptoms. Here with wife. They both request testing. No known COVID-19 exposures known.   Social History   Tobacco Use  Smoking Status Former Smoker  . Packs/day: 1.00  . Years: 20.00  . Pack years: 20.00  . Types: Cigarettes  . Quit date: 89  . Years since quitting: 24.6  Smokeless Tobacco Never Used    ROS: As per HPI.   OBJECTIVE:  Vitals:   04/30/19 1402  BP: 129/66  Pulse: 68  Resp: 18  Temp: 97.7 F (36.5 C)  TempSrc: Temporal  SpO2: 97%     General appearance: alert; NAD HEENT: nasal congestion; clear runny nose; throat irritation secondary to post-nasal drainage Neck: supple without LAD Lungs: unlabored respirations Ext: no LE edema Skin: warm and dry Psychological: alert and cooperative; normal mood and affect    No Known Allergies  Past Medical History:  Diagnosis Date  . Barrett's esophagus   . Cataract    bil cateracts removed 2008  . GERD (gastroesophageal reflux disease)   . Hyperlipidemia   . Palpitations 03/29/2019  . Sleep apnea    pt does not wear a c-pap  . Vertigo    Family History  Problem Relation Age of Onset  . Esophageal cancer Mother 53       died soon  after dx  . Colon cancer Neg Hx   . Pancreatic cancer Neg Hx   . Prostate cancer Neg Hx   . Rectal cancer Neg Hx   . Stomach cancer Neg Hx    Social History   Socioeconomic History  . Marital status: Married    Spouse name: Not on file  . Number of children: 2  . Years of education: Not on file  . Highest education level: Not on file  Occupational History  . Not on file  Social Needs  . Financial resource strain: Not on file  . Food insecurity    Worry: Not on file    Inability: Not on file  . Transportation needs    Medical: Not on file    Non-medical: Not on file  Tobacco Use  . Smoking status: Former Smoker    Packs/day: 1.00    Years: 20.00    Pack years: 20.00    Types: Cigarettes    Quit date: 1996    Years since quitting: 24.6  . Smokeless tobacco: Never Used  Substance and Sexual Activity  . Alcohol use: Yes    Alcohol/week: 4.0 standard drinks    Types: 4 Glasses of wine per week    Comment: DAILY  . Drug use: No  . Sexual activity: Not on file  Lifestyle  . Physical activity    Days per week: Not on file  Minutes per session: Not on file  . Stress: Not on file  Relationships  . Social Herbalist on phone: Not on file    Gets together: Not on file    Attends religious service: Not on file    Active member of club or organization: Not on file    Attends meetings of clubs or organizations: Not on file    Relationship status: Not on file  . Intimate partner violence    Fear of current or ex partner: Not on file    Emotionally abused: Not on file    Physically abused: Not on file    Forced sexual activity: Not on file  Other Topics Concern  . Not on file  Social History Narrative  . Not on file           Vanessa Kick, MD 04/30/19 1511

## 2019-04-30 NOTE — ED Triage Notes (Signed)
Pt here for covid testing; pt denies sx or exposure

## 2019-05-03 DIAGNOSIS — R002 Palpitations: Secondary | ICD-10-CM | POA: Diagnosis not present

## 2019-05-03 LAB — NOVEL CORONAVIRUS, NAA (HOSP ORDER, SEND-OUT TO REF LAB; TAT 18-24 HRS): SARS-CoV-2, NAA: NOT DETECTED

## 2019-05-06 DIAGNOSIS — D692 Other nonthrombocytopenic purpura: Secondary | ICD-10-CM | POA: Diagnosis not present

## 2019-05-06 DIAGNOSIS — D2272 Melanocytic nevi of left lower limb, including hip: Secondary | ICD-10-CM | POA: Diagnosis not present

## 2019-05-06 DIAGNOSIS — D1801 Hemangioma of skin and subcutaneous tissue: Secondary | ICD-10-CM | POA: Diagnosis not present

## 2019-05-06 DIAGNOSIS — L821 Other seborrheic keratosis: Secondary | ICD-10-CM | POA: Diagnosis not present

## 2019-05-10 ENCOUNTER — Encounter: Payer: Self-pay | Admitting: Cardiology

## 2019-05-10 ENCOUNTER — Other Ambulatory Visit: Payer: Self-pay

## 2019-05-10 ENCOUNTER — Ambulatory Visit (INDEPENDENT_AMBULATORY_CARE_PROVIDER_SITE_OTHER): Payer: PPO | Admitting: Cardiology

## 2019-05-10 VITALS — BP 139/80 | HR 65 | Temp 98.0°F | Ht 71.0 in | Wt 196.0 lb

## 2019-05-10 DIAGNOSIS — I2510 Atherosclerotic heart disease of native coronary artery without angina pectoris: Secondary | ICD-10-CM

## 2019-05-10 DIAGNOSIS — Z87891 Personal history of nicotine dependence: Secondary | ICD-10-CM | POA: Diagnosis not present

## 2019-05-10 DIAGNOSIS — I471 Supraventricular tachycardia: Secondary | ICD-10-CM

## 2019-05-10 DIAGNOSIS — E7800 Pure hypercholesterolemia, unspecified: Secondary | ICD-10-CM

## 2019-05-10 NOTE — Progress Notes (Signed)
Primary Physician:  Deland Pretty, MD   Patient ID: Kenneth Ingram., male    DOB: 1945/10/25, 73 y.o.   MRN: [id number]  Subjective:    Chief Complaint  Patient presents with  . Palpitations    f/u tests   . Follow-up    HPI: Kenneth Ingram  is a 73 y.o. male  with hyperlipidemia, OSA not on CPAP, former tobacco use, coronary calcification on CT scan previously seen by Dr. Gwenlyn Found and underwent exercise nuclear stress test in Oct 2019 that was considered low risk study, per patient preference, recently evaluated by [place] for palpitations.   In early june, patient noted occasional episodes of palpitations. He was placed on event monitor with PCP; however, has not had any recurrence of symptoms since. Symptoms were suggestive of PAC/PVC's. He underwent echocardiogram and is here to discuss results.   Patient denies any history of hypertension. States hyperlipidemia is well controlled. He denies any chest pain, shortness of breath, leg swelling, PND or orthopnea, or symptoms of claudication or TIA. He was previously told to have sleep apnea; however, did not want to wear CPAP. States that he doesn't feel that he needs this. He is active with working out with a trainer 5 days a week at the gym that he tolerates well. Continues to notice fatigue.  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 He continues to have back pain and knee pain related to this. He is a former smoker that quit in the late 90's.   Past Medical History:  Diagnosis Date  . Barrett's esophagus   . Cataract    bil cateracts removed 2008  . GERD (gastroesophageal reflux disease)   . Hyperlipidemia   . Palpitations 03/29/2019  . Sleep apnea    pt does not wear a c-pap  . Vertigo     Past Surgical History:  Procedure Laterality Date  . CATARACT EXTRACTION W/ INTRAOCULAR LENS  IMPLANT, BILATERAL  2007  .  COLONOSCOPY    . KNEE ARTHROSCOPY  1997   right  . TONSILLECTOMY  1968  . UPPER GASTROINTESTINAL ENDOSCOPY    . WISDOM TOOTH EXTRACTION      Social History   Socioeconomic History  . Marital status: Married    Spouse name: Not on file  . Number of children: 2  . Years of education: Not on file  . Highest education level: Not on file  Occupational History  . Not on file  Social Needs  . Financial resource strain: Not on file  . Food insecurity    Worry: Not on file    Inability: Not on file  . Transportation needs    Medical: Not on file    Non-medical: Not on file  Tobacco Use  . Smoking status: Former Smoker    Packs/day: 1.00    Years: 20.00    Pack years: 20.00    Types: Cigarettes    Quit date: 1996    Years since quitting: 24.6  . Smokeless tobacco: Never Used  Substance and Sexual Activity  . Alcohol use: Yes    Alcohol/week: 4.0 standard drinks    Types: 4 Glasses of wine per week    Comment: DAILY  . Drug use: No  . Sexual activity: Not on file  Lifestyle  . Physical activity    Days per week: Not on file  Minutes per session: Not on file  . Stress: Not on file  Relationships  . Social Herbalist on phone: Not on file    Gets together: Not on file    Attends religious service: Not on file    Active member of club or organization: Not on file    Attends meetings of clubs or organizations: Not on file    Relationship status: Not on file  . Intimate partner violence    Fear of current or ex partner: Not on file    Emotionally abused: Not on file    Physically abused: Not on file    Forced sexual activity: Not on file  Other Topics Concern  . Not on file  Social History Narrative  . Not on file    Review of Systems  Constitution: Positive for malaise/fatigue. Negative for decreased appetite, weight gain and weight loss.  Eyes: Negative for visual disturbance.  Cardiovascular: Negative for chest pain, claudication, dyspnea on  exertion, leg swelling, orthopnea, palpitations and syncope.  Respiratory: Negative for hemoptysis and wheezing.   Endocrine: Negative for cold intolerance and heat intolerance.  Hematologic/Lymphatic: Does not bruise/bleed easily.  Skin: Negative for nail changes.  Musculoskeletal: Positive for back pain and joint pain (left knee). Negative for muscle weakness and myalgias.  Gastrointestinal: Negative for abdominal pain, change in bowel habit, nausea and vomiting.  Neurological: Negative for difficulty with concentration, dizziness, focal weakness and headaches.  Psychiatric/Behavioral: Negative for altered mental status and suicidal ideas.  All other systems reviewed and are negative.     Objective:  Blood pressure 139/80, pulse 65, temperature 98 F (36.7 C), height 5\' 11"  (1.803 m), weight 196 lb (88.9 kg), SpO2 97 %. Body mass index is 27.34 kg/m.    Physical Exam  Constitutional: He is oriented to person, place, and time. Vital signs are normal. He appears well-developed and well-nourished.  HENT:  Head: Normocephalic and atraumatic.  Neck: Normal range of motion.  Cardiovascular: Normal rate, regular rhythm and intact distal pulses. Exam reveals a midsystolic click.  Murmur heard. High-pitched blowing holosystolic murmur is present with a grade of 1/6 at the apex. Pulmonary/Chest: Effort normal and breath sounds normal. No accessory muscle usage. No respiratory distress.  Pectus excavatum  Abdominal: Soft. Bowel sounds are normal.  Musculoskeletal: Normal range of motion.  Neurological: He is alert and oriented to person, place, and time.  Skin: Skin is warm and dry.  Vitals reviewed.  Radiology:  CT of chest 06/06/2018:  1. Tiny small pulmonary nodules are unchanged when compared with previous exam measuring up to 4 mm. These are compatible with a benign process and require no further follow-up. Stable calcified right upper lobe granuloma. 2.  Aortic Atherosclerosis  (ICD10-I70.0). 3. Multi vessel coronary artery atherosclerotic calcifications. 4. Hepatic steatosis.  Laboratory examination:    CMP Latest Ref Rng & Units 06/11/2016  Glucose 65 - 99 mg/dL 116(H)  BUN 6 - 20 mg/dL 14  Creatinine 0.61 - 1.24 mg/dL 0.90  Sodium 135 - 145 mmol/L 138  Potassium 3.5 - 5.1 mmol/L 3.4(L)  Chloride 101 - 111 mmol/L 106  CO2 22 - 32 mmol/L 23  Calcium 8.9 - 10.3 mg/dL 8.9   CBC Latest Ref Rng & Units 06/11/2016  WBC 4.0 - 10.5 K/uL 6.1  Hemoglobin 13.0 - 17.0 g/dL 13.9  Hematocrit 39.0 - 52.0 % 39.2  Platelets 150 - 400 K/uL 207   Lipid Panel  No results found for: CHOL, TRIG,  HDL, CHOLHDL, VLDL, LDLCALC, LDLDIRECT HEMOGLOBIN A1C No results found for: HGBA1C, MPG TSH No results for input(s): TSH in the last 8760 hours.  PRN Meds:. Medications Discontinued During This Encounter  Medication Reason  . meclizine (ANTIVERT) 25 MG tablet    Current Meds  Medication Sig  . aspirin EC 81 MG tablet Take 81 mg by mouth daily.  Marland Kitchen atorvastatin (LIPITOR) 10 MG tablet Take 10 mg by mouth daily.  Marland Kitchen levothyroxine (SYNTHROID) 25 MCG tablet   . Multiple Vitamins-Minerals (MULTIVITAMIN WITH MINERALS) tablet Take 1 tablet by mouth daily.  . pantoprazole (PROTONIX) 40 MG tablet Take 1 tablet (40 mg total) by mouth daily. Please schedule a virtual visit with Dr. Havery Moros for refills: [phone]  . Polyethyl Glycol-Propyl Glycol (SYSTANE FREE OP) Apply 1 drop to eye as directed.    Cardiac Studies:   ZIO Patch monitor 6/26-03/27/2019: Normal sinus rhythm.  24 brief episodes of SVT occurred with the longest lasting 11.7 seconds.  Fastest episode was for 15 beats with a max rate of 207 bpm.  No symptoms reported with SVT episodes.  One episode of patient triggered event correlated with normal sinus rhythm without reported symptoms.  Accidental push?  No A. fib was noted.  Echo- 04/24/2019 1. Normal LV systolic function with EF 55%. Left ventricle cavity is normal in  size. Moderate concentric hypertrophy of the left ventricle. Normal global wall motion. Normal diastolic filling pattern. Calculated EF 55%. 2. Mild (Grade I) mitral regurgitation. 3. IVC is normal with a respiratory response of <50%.  Exercise nuclear stress test 06/28/2018:   The left ventricular ejection fraction is mildly decreased (45-54%).  Nuclear stress EF: 53%.  There was no ST segment deviation noted during stress.  Blood pressure demonstrated a normal response to exercise.  No T wave inversion was noted during stress.  The study is normal. This is a low risk study.  Assessment:     ICD-10-CM   1. Paroxysmal SVT (supraventricular tachycardia) (HCC)  I47.1   2. Pure hypercholesterolemia  E78.00   3. Coronary artery calcification seen on CT scan  I25.10   4. Former tobacco use  Z87.891     EKG 03/29/2019: Normal sinus rhythm at 72 bpm, left atrial enlargement, normal axis, no evidence of ischemia.    Recommendations:   Patient is a pleasant 73 year old male with controlled hyperlipidemia, coronary calcification on CT scan, it was a not on CPAP due to patient preference, former tobacco use, recent diagnosis of hypothyroidism recently evaluated by [place] for palpitations.  I reviewed event monitor from PCP office, had 24 brief episodes of SVT with longest lasting for 15 beats.  These episodes were asymptomatic.  He continues to deny any recurrence of palpitations and is overall well except for fatigue which appears to be chronic.  Suspect his SVT is related to underlying untreated sleep apnea and had recommended treatment for the same; however, patient is very reluctant to this.  He will discuss with his PCP.  As his episodes were very brief and asymptomatic, will continue with conservative measures for now unless symptoms worsen.  I discussed vagal maneuvers and explained how to do these.  Echocardiogram was without any structural abnormalities.  He does have moderate LVH  likely related to being very active.  No history of hypertension. He will continue to follow up with PCP regarding his lipids. Recommend continuing with Lipitor in view of coronary artery calcification on CT scan. We will plan to see him back in 6  months for follow-up, and if symptoms remain stable at that time, will consider PRN follow-up.  Miquel Dunn, MSN, APRN, FNP-C [facility]. [place] Office: [phone] Fax: [phone]

## 2019-05-13 ENCOUNTER — Encounter: Payer: Self-pay | Admitting: Cardiology

## 2019-05-16 DIAGNOSIS — E039 Hypothyroidism, unspecified: Secondary | ICD-10-CM | POA: Diagnosis not present

## 2019-05-20 DIAGNOSIS — Z23 Encounter for immunization: Secondary | ICD-10-CM | POA: Diagnosis not present

## 2019-05-23 ENCOUNTER — Other Ambulatory Visit: Payer: Self-pay | Admitting: Gastroenterology

## 2019-05-23 DIAGNOSIS — Z800 Family history of malignant neoplasm of digestive organs: Secondary | ICD-10-CM

## 2019-05-23 DIAGNOSIS — Z1211 Encounter for screening for malignant neoplasm of colon: Secondary | ICD-10-CM

## 2019-05-23 DIAGNOSIS — K219 Gastro-esophageal reflux disease without esophagitis: Secondary | ICD-10-CM

## 2019-05-28 ENCOUNTER — Telehealth: Payer: Self-pay | Admitting: Gastroenterology

## 2019-05-28 ENCOUNTER — Other Ambulatory Visit: Payer: Self-pay | Admitting: Gastroenterology

## 2019-05-28 DIAGNOSIS — K219 Gastro-esophageal reflux disease without esophagitis: Secondary | ICD-10-CM

## 2019-05-28 DIAGNOSIS — Z800 Family history of malignant neoplasm of digestive organs: Secondary | ICD-10-CM

## 2019-05-28 DIAGNOSIS — Z1211 Encounter for screening for malignant neoplasm of colon: Secondary | ICD-10-CM

## 2019-05-28 MED ORDER — PANTOPRAZOLE SODIUM 40 MG PO TBEC: 40.0000 mg | DELAYED_RELEASE_TABLET | Freq: Every day | ORAL | 1 refills | Status: DC

## 2019-05-28 NOTE — Telephone Encounter (Signed)
Pt is scheduled for OV 07/03/19 and requested a refill on pantoprazole.

## 2019-05-28 NOTE — Telephone Encounter (Signed)
Script sent for 30 days with 1 refill. Pt needs to keep October appt for further refills

## 2019-05-29 ENCOUNTER — Telehealth: Payer: Self-pay | Admitting: Gastroenterology

## 2019-05-29 NOTE — Telephone Encounter (Signed)
Called [facility] and asked them to transfer script to the [employer] store. Asked that they contact patient and let him know. Called [facility] store and informed them  LM for pt.

## 2019-06-25 ENCOUNTER — Ambulatory Visit: Payer: PPO | Admitting: Sports Medicine

## 2019-06-25 ENCOUNTER — Other Ambulatory Visit: Payer: Self-pay

## 2019-06-25 ENCOUNTER — Ambulatory Visit
Admission: RE | Admit: 2019-06-25 | Discharge: 2019-06-25 | Disposition: A | Discharge status: Home / Self Care | Payer: PPO | Source: Ambulatory Visit | Attending: Sports Medicine | Admitting: Sports Medicine

## 2019-06-25 VITALS — BP 110/60 | Ht 71.0 in | Wt 194.0 lb

## 2019-06-25 DIAGNOSIS — M25531 Pain in right wrist: Secondary | ICD-10-CM | POA: Diagnosis not present

## 2019-06-25 DIAGNOSIS — M25561 Pain in right knee: Secondary | ICD-10-CM | POA: Diagnosis not present

## 2019-06-25 DIAGNOSIS — M1711 Unilateral primary osteoarthritis, right knee: Secondary | ICD-10-CM

## 2019-06-25 DIAGNOSIS — M1712 Unilateral primary osteoarthritis, left knee: Secondary | ICD-10-CM | POA: Diagnosis not present

## 2019-06-25 DIAGNOSIS — G8929 Other chronic pain: Secondary | ICD-10-CM

## 2019-06-25 NOTE — Progress Notes (Signed)
[place] [address] [place], [place] [address] Phone: [phone] Fax: [phone]   Patient Name: Kenneth Ingram. Date of Birth: 30-Mar-1946 Medical Record Number: [id number] Gender: male Date of Encounter: 06/25/2019  SUBJECTIVE:      Chief Complaint:  Bilateral knee and right wrist pain   HPI:  Kenneth Ingram is a 73 year old RHD gentleman presenting with bilateral knee pain, right worse than left.  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  In 1997 he had a right knee scope, but since that time has never had it evaluated.  He states over the last year the pain has become more frequent and he is unable to do lower body exercises with his trainer.  Aggravating factors include walking and going up and down stairs.  Alleviating factors include wearing his brace on his knee and topical CBD oil.  He describes the pain is achy.  Denies any radiating pain down his leg.  Denies swelling, weakness, skin changes, numbness, erythema. His left knee is not as bothersome, but last week he went hunting and had difficulty walking and bending down.  Patient also complaining of right wrist pain.  He hit his hand on a steel plate on accident in the past.  At the time he got x-rays that were negative for fracture.  Unfortunately shortly after that he had a Marlette injury on the same hand and has had pain on the ulnar side of his wrist, more on the dorsum.  Denies radiating pain or numbness and tingling in his fingers.     ROS:     See HPI.   PERTINENT  PMH / PSH / FH / SH:  Past Medical, Surgical, Social, and Family History Reviewed & Updated in the EMR. Pertinent findings include:  1997 right knee arthroscopy, former tobacco use   OBJECTIVE:  BP 110/60   Ht 5\' 11"  (1.803 m)   Wt 194 lb (88 kg)   BMI 27.06 kg/m  Physical Exam:  Vital signs are reviewed.   GEN: Alert and oriented, NAD  Pulm: Breathing unlabored PSY: normal mood, congruent affect  MSK: Right knee: Normal to inspection with no erythema or effusion or obvious bony abnormalities. Palpation normal with no warmth, joint line tenderness, patellar tenderness, or condyle tenderness. ROM full in flexion and extension and lower leg rotation. Ligaments with solid consistent endpoints including ACL, PCL, LCL, MCL. Positive Mcmurray's and Thessaly tests. Non painful patellar compression. Patellar glide without crepitus. Patellar and quadriceps tendons unremarkable. Hamstring and quadriceps strength is normal.  Neurovascularly intact.  Left knee: Normal to inspection with no erythema or effusion or obvious bony abnormalities. Palpation normal with no warmth, joint line tenderness, patellar tenderness, or condyle tenderness. ROM full in flexion and extension and lower leg rotation. Ligaments with solid consistent endpoints including ACL, PCL, LCL, MCL. Negative Mcmurray's and Thessaly tests. Non painful patellar compression. Patellar glide without crepitus. Patellar and quadriceps tendons unremarkable. Hamstring and quadriceps strength is normal.  Neurovascularly intact.  Right wrist: Normal to inspection with no erythema or effusion Mild tenderness at base of fourth and fifth metacarpal ROM full in flexion, extension, supination, and pronation Negative squeeze test   ASSESSMENT & PLAN:   1. Bilateral knee pain, right worse than left  Given the chronicity of the symptoms, we will order plain film images of both knees to assess severity of osteoarthritis.  He  is to continue using knee brace when exercising.  We will follow-up after imaging to discuss treatment options that are not limited to therapy, injections, or medications.  2.  Right wrist pain  X-ray today of the right wrist to evaluate scapholunate joint.  Recommended icing area after workouts.  We will follow-up after imaging to discuss possible  treatment options.  Overall, the symptoms are not debilitating, but there is concern for arthritis given the trauma to the wrist in the past.   Lanier Clam, DO, ATC Sports Medicine Fellow  Patient seen and evaluated with the sports medicine fellow.  I agree with the above plan of care.  X-rays of the right knee show moderate medial compartmental narrowing, especially on the 30 degree flexion view.  Left knee x-ray shows only mild degenerative changes.  Right wrist x-rays shows intercarpal degenerative changes but nothing acute.  Treatment as above.  Patient will continue working on quad and hamstring strengthening.  He also finds relief with topical CBD oil.  He may continue with this as needed as well.  We did discuss the possibility of a cortisone injection down the road if symptoms warrant.  Follow-up as needed.

## 2019-07-03 ENCOUNTER — Other Ambulatory Visit: Payer: Self-pay

## 2019-07-03 ENCOUNTER — Encounter: Payer: Self-pay | Admitting: Gastroenterology

## 2019-07-03 ENCOUNTER — Ambulatory Visit: Payer: PPO | Admitting: Gastroenterology

## 2019-07-03 VITALS — BP 110/60 | HR 80 | Temp 98.1°F | Ht 71.0 in | Wt 195.4 lb

## 2019-07-03 DIAGNOSIS — K219 Gastro-esophageal reflux disease without esophagitis: Secondary | ICD-10-CM | POA: Diagnosis not present

## 2019-07-03 DIAGNOSIS — Z800 Family history of malignant neoplasm of digestive organs: Secondary | ICD-10-CM

## 2019-07-03 MED ORDER — PANTOPRAZOLE SODIUM 40 MG PO TBEC: 40.0000 mg | DELAYED_RELEASE_TABLET | Freq: Every day | ORAL | 3 refills | Status: DC

## 2019-07-03 NOTE — Telephone Encounter (Signed)
Pt seen by [facility] on 7/10 and 8/21 for palpitations and PSVT

## 2019-07-03 NOTE — Progress Notes (Signed)
HPI :  73 year old male here for follow-up visit.  He has been followed here for history of reflux and questionable history of Barrett's esophagus.   His mother died of esophageal cancer at age 19.  He is quite concerned about this possibility for him and had an endoscopy with [place] in the September 2018.  He had a 3 cm hiatal hernia.  Z-line slightly irregular, biopsies taken showed no evidence of Barrett's esophagus.  Has had a gastric inlet patch.  He had an EGD in 2013 and in 2008 concerning for a very short segment of Barrett's esophagus, biopsies did not show this either.  He has been taking Protonix 40 mg a day for his reflux symptoms.  He denies any breakthrough symptoms, no dysphagia.  No nausea or vomiting, no abdominal pain, no changes in his bowel habits.  Overall he is feeling pretty well in this regard.  He asked about surveillance EGD for future screening, he is very anxious about Barrett's esophagus potential and his mother's family history of esophageal cancer which we discussed.  We also discussed long-term risk benefits of chronic PPI use.  His colonoscopy otherwise was normal in 2018, no polyps.   EGD 03/23/2012 - irregular z-line, small hiatal hernia - biopsies negative for Barrett's EGD 2008 - reportedly had Barrett's esophagus - records not available  EGD 05/29/2017 - Esophagogastric landmarks were identified: the Z-line was found at 40 cm, the gastroesophageal junction was found at 40 cm and the upper extent of the gastric folds was found at 43 cm from the incisors. Findings: - A 3 cm hiatal hernia was present. - The Z-line was irregular and was found 40 cm from the incisors, one tongue of salmon colored mucosa without nodularity noted, roughly 52mm to 31mm in length without nodularity . Biopsies were taken with a cold forceps for histology. 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 One  of the areas was small (few mm) while the other was larger than typically noted ectopic gastric mucosa (perhaps 2cm in length). Biopsies were taken with a cold forceps for histology to ensure no Barrett's esophagus. - The exam of the esophagus was otherwise normal. - The entire examined stomach was normal. - The duodenal bulb and second portion of the duodenum were normal.  No evidence of BE on path.  Colonoscopy 05/29/17 - hemorrhoids, otherwise normal     Past Medical History:  Diagnosis Date  . Barrett's esophagus   . Cataract    bil cateracts removed 2008  . GERD (gastroesophageal reflux disease)   . Hyperlipidemia   . Palpitations 03/29/2019  . Sleep apnea    pt does not wear a c-pap  . Vertigo      Past Surgical History:  Procedure Laterality Date  . CATARACT EXTRACTION W/ INTRAOCULAR LENS  IMPLANT, BILATERAL  2007  . COLONOSCOPY    . KNEE ARTHROSCOPY  1997   right  . TONSILLECTOMY  1968  . UPPER GASTROINTESTINAL ENDOSCOPY    . WISDOM TOOTH EXTRACTION     Family History  Problem Relation Age of Onset  . Esophageal cancer Mother 68       died soon after dx  . Colon cancer Neg Hx   . Pancreatic cancer Neg Hx   . Prostate cancer Neg Hx   . Rectal cancer Neg Hx   . Stomach cancer Neg Hx    Social History  Tobacco Use  . Smoking status: Former Smoker    Packs/day: 1.00    Years: 20.00    Pack years: 20.00    Types: Cigarettes    Quit date: 1996    Years since quitting: 24.8  . Smokeless tobacco: Never Used  Substance Use Topics  . Alcohol use: Yes    Alcohol/week: 4.0 standard drinks    Types: 4 Glasses of wine per week    Comment: DAILY  . Drug use: No   Current Outpatient Medications  Medication Sig Dispense Refill  . aspirin EC 81 MG tablet Take 81 mg by mouth daily.    Marland Kitchen atorvastatin (LIPITOR) 10 MG tablet Take 10 mg by mouth daily.    Marland Kitchen levothyroxine (SYNTHROID) 25 MCG tablet     . pantoprazole (PROTONIX) 40 MG tablet Take 1 tablet (40 mg  total) by mouth daily. Please keep your appt with Dr. Havery Moros in October for further refills. Thanks 30 tablet 1  . Polyethyl Glycol-Propyl Glycol (SYSTANE FREE OP) Apply 1 drop to eye as directed.     No current facility-administered medications for this visit.    No Known Allergies   Review of Systems: All systems reviewed and negative except where noted in HPI.    Dg Wrist Complete Right  Result Date: 06/26/2019 CLINICAL DATA:  Right wrist pain. No known injury. EXAM: RIGHT WRIST - COMPLETE 3+ VIEW COMPARISON:  None. FINDINGS: The mineralization and alignment are normal. There is no evidence of acute fracture or dislocation. There are scattered mild intercarpal degenerative changes, primarily at the scaphotrapeziotrapezoidal and capitolunate articulations. There is a probable intraosseous ganglion in the hamate. No erosive changes or focal soft tissue abnormalities. IMPRESSION: Mild intercarpal degenerative changes. No acute osseous findings or evidence of inflammatory arthropathy. Electronically Signed   By: Richardean Sale M.D.   On: 06/26/2019 09:19   Dg Knee Complete 4 Views Left  Result Date: 06/26/2019 CLINICAL DATA:  Bilateral knee pain for several years. No known injury. EXAM: LEFT KNEE - COMPLETE 4+ VIEW COMPARISON:  None. FINDINGS: The mineralization and alignment are normal. There is no evidence of acute fracture or dislocation. There is mild joint space narrowing in the medial compartment. The additional joint spaces are preserved. No significant joint effusion. Mild spurring at the quadriceps insertion on the patella. IMPRESSION: Mild degenerative changes in the medial compartment. No acute osseous findings. Electronically Signed   By: Richardean Sale M.D.   On: 06/26/2019 09:17   Dg Knee Complete 4 Views Right  Result Date: 06/26/2019 CLINICAL DATA:  Bilateral knee pain for several years. No known injury. EXAM: RIGHT KNEE - COMPLETE 4+ VIEW COMPARISON:  None. FINDINGS: The  mineralization and alignment are normal. There is no evidence of acute fracture or dislocation. There is mild joint space narrowing and osteophyte formation in the medial compartment. The patellofemoral and lateral compartment joint spaces are relatively preserved. No significant joint effusion. Mild spurring at the quadriceps insertion on the patella. IMPRESSION: Mild degenerative changes in the medial compartment. No acute osseous findings. Electronically Signed   By: Richardean Sale M.D.   On: 06/26/2019 09:16    Physical Exam: BP 110/60   Pulse 80   Temp 98.1 F (36.7 C)   Ht 5\' 11"  (1.803 m)   Wt 195 lb 6.4 oz (88.6 kg)   BMI 27.25 kg/m  Constitutional: Pleasant,well-developed, male in no acute distress. HEENT: Normocephalic and atraumatic. Conjunctivae are normal. No scleral icterus. Neck supple.  Cardiovascular: Normal rate,  regular rhythm.  Pulmonary/chest: Effort normal and breath sounds normal. No wheezing, rales or rhonchi. Abdominal: Soft, nondistended, nontender. B. There are no masses palpable. No hepatomegaly. Extremities: no edema Lymphadenopathy: No cervical adenopathy noted. Neurological: Alert and oriented to person place and time. Skin: Skin is warm and dry. No rashes noted. Psychiatric: Normal mood and affect. Behavior is normal.   ASSESSMENT AND PLAN: 73 year old male here for reassessment of the following issues:  GERD / FH of esophageal cancer - well controlled reflux on 40 mg once daily of Protonix.  He has had an irregular Z-line on prior EGDs, biopsies have not shown Barrett's esophagus.  I reassured him on the biopsy results from his last endoscopy.  Even if he did have Barrett's esophagus this is considered a very low risk lesion given it's an irregular z line, and risk for malignancy associated this is extremely low.  I do not feel strongly that he warrants further surveillance for this, however in light of his mother's history he is quite concerned about it  and continues to wish to have surveillance, we may consider this in another 5 years from his last exam if he strongly wishes to do this.  Otherwise, I discussed long-term potential risks of Protonix.  He denies any chronic kidney disease or osteopenia.  I recommend the lowest dose of PPI needed to control symptoms.  He will try cutting the tablet in half and see how 20 mg once a day works.  If this works for him he will continue on that dose.  Likewise if it does not work for him, he can resume 40 mg once a day.  Hoskins Cellar, MD [facility]

## 2019-07-03 NOTE — Patient Instructions (Addendum)
If you are age 71 or older, your body mass index should be between 23-30. Your Body mass index is 27.25 kg/m. If this is out of the aforementioned range listed, please consider follow up with your Primary Care Provider.  If you are age 79 or younger, your body mass index should be between 19-25. Your Body mass index is 27.25 kg/m. If this is out of the aformentioned range listed, please consider follow up with your Primary Care Provider.   To help prevent the possible spread of infection to our patients, communities, and staff; we will be implementing the following measures:  As of now we are not allowing any visitors/family members to accompany you to any upcoming appointments with [facility]. If you have any concerns about this please contact our office to discuss prior to the appointment.   We have sent the following medications to your pharmacy for you to pick up at your convenience: Protonix 40mg : Take once a day  You will be due for a recall endoscopy in September 2023. We will send you a reminder in the mail when it gets closer to that time.  Thank you for entrusting me with your care and for choosing [facility], Dr.  Cellar

## 2019-07-24 DIAGNOSIS — E039 Hypothyroidism, unspecified: Secondary | ICD-10-CM | POA: Diagnosis not present

## 2019-07-25 DIAGNOSIS — E039 Hypothyroidism, unspecified: Secondary | ICD-10-CM | POA: Diagnosis not present

## 2019-09-26 DIAGNOSIS — E039 Hypothyroidism, unspecified: Secondary | ICD-10-CM | POA: Diagnosis not present

## 2019-10-08 DIAGNOSIS — H02831 Dermatochalasis of right upper eyelid: Secondary | ICD-10-CM | POA: Diagnosis not present

## 2019-10-08 DIAGNOSIS — H02834 Dermatochalasis of left upper eyelid: Secondary | ICD-10-CM | POA: Diagnosis not present

## 2019-10-14 ENCOUNTER — Ambulatory Visit: Payer: PPO | Attending: Internal Medicine

## 2019-10-14 DIAGNOSIS — Z23 Encounter for immunization: Secondary | ICD-10-CM | POA: Insufficient documentation

## 2019-10-14 NOTE — Progress Notes (Signed)
   [id number]  Name:  Kenneth Ingram.    MRN: [id number] DOB: [birth year]  10/14/2019  [name] was observed post Covid-19 immunization for 15 minutes without incidence. He was provided with Vaccine Information Sheet and instruction to access the V-Safe system.   [name] was instructed to call 911 with any severe reactions post vaccine: Marland Kitchen Difficulty breathing  . Swelling of your face and throat  . A fast heartbeat  . A bad rash all over your body  . Dizziness and weakness    Immunizations Administered    Name Date Dose VIS Date Route   Pfizer COVID-19 Vaccine 10/14/2019  1:25 PM 0.3 mL 08/30/2019 Intramuscular   Manufacturer: [place]   Lot: [id number]   [place]: [id number]

## 2019-11-04 ENCOUNTER — Ambulatory Visit: Payer: PPO | Attending: Internal Medicine

## 2019-11-04 DIAGNOSIS — Z23 Encounter for immunization: Secondary | ICD-10-CM | POA: Insufficient documentation

## 2019-11-04 NOTE — Progress Notes (Signed)
   [id number] Vaccination [facility]  Name:  Sakariye Yun.    MRN: [id number] DOB: [birth year]  11/04/2019  Mr. Beach was observed post Covid-19 immunization for 15 minutes without incidence. He was provided with Vaccine Information Sheet and instruction to access the V-Safe system.   Mr. Gotto was instructed to call 911 with any severe reactions post vaccine: Marland Kitchen Difficulty breathing  . Swelling of your face and throat  . A fast heartbeat  . A bad rash all over your body  . Dizziness and weakness    Immunizations Administered    Name Date Dose VIS Date Route   Pfizer COVID-19 Vaccine 11/04/2019 11:21 AM 0.3 mL 08/30/2019 Intramuscular   Manufacturer: [place]   Lot: [id number]   [place]: [id number]

## 2019-11-14 ENCOUNTER — Encounter: Payer: Self-pay | Admitting: Cardiology

## 2019-11-14 ENCOUNTER — Other Ambulatory Visit: Payer: Self-pay

## 2019-11-14 ENCOUNTER — Ambulatory Visit: Payer: PPO | Admitting: Cardiology

## 2019-11-14 VITALS — BP 133/56 | HR 87 | Temp 98.2°F | Resp 16 | Ht 71.0 in | Wt 200.6 lb

## 2019-11-14 DIAGNOSIS — I2510 Atherosclerotic heart disease of native coronary artery without angina pectoris: Secondary | ICD-10-CM | POA: Diagnosis not present

## 2019-11-14 DIAGNOSIS — E7800 Pure hypercholesterolemia, unspecified: Secondary | ICD-10-CM

## 2019-11-14 DIAGNOSIS — I471 Supraventricular tachycardia, unspecified: Secondary | ICD-10-CM

## 2019-11-14 NOTE — Progress Notes (Signed)
Primary Physician/Referring:  Deland Pretty, MD  Patient ID: Kenneth Ingram., male    DOB: 17-Jul-1946, 74 y.o.   MRN: [id number]  Chief Complaint  Patient presents with  . Palpitations   HPI:    Kenneth Ingram.  is a 74 y.o. Caucasian male with hyperlipidemia, OSA not on CPAP, former tobacco use, coronary calcification on CT scan previously seen by Dr. Gwenlyn Found and underwent exercise nuclear stress test in Oct 2019 that was considered low risk study presents forannual visit for palpitations and coronary atherosclerosis f/u.  He is presently asymptomatic and continues to exercise 5 days a week vigorously.  He has not had any further palpitations.  Past Medical History:  Diagnosis Date  . Barrett's esophagus   . Cataract    bil cateracts removed 2008  . GERD (gastroesophageal reflux disease)   . Hyperlipidemia   . Palpitations 03/29/2019  . Sleep apnea    pt does not wear a c-pap  . Vertigo    Past Surgical History:  Procedure Laterality Date  . CATARACT EXTRACTION W/ INTRAOCULAR LENS  IMPLANT, BILATERAL  2007  . COLONOSCOPY    . KNEE ARTHROSCOPY  1997   right  . TONSILLECTOMY  1968  . UPPER GASTROINTESTINAL ENDOSCOPY    . WISDOM TOOTH EXTRACTION     Family History  Problem Relation Age of Onset  . Esophageal cancer Mother 26       died soon after dx  . Colon cancer Neg Hx   . Pancreatic cancer Neg Hx   . Prostate cancer Neg Hx   . Rectal cancer Neg Hx   . Stomach cancer Neg Hx     Social History   Tobacco Use  . Smoking status: Former Smoker    Packs/day: 1.00    Years: 20.00    Pack years: 20.00    Types: Cigarettes    Quit date: 1996    Years since quitting: 25.1  . Smokeless tobacco: Never Used  Substance Use Topics  . Alcohol use: Yes    Alcohol/week: 4.0 standard drinks    Types: 4 Glasses of wine per week    Comment: DAILY   ROS  Review of Systems  Cardiovascular: Negative for chest pain, dyspnea on exertion and leg swelling.    Gastrointestinal: Negative for melena.   Objective  Blood pressure (!) 133/56, pulse 87, temperature 98.2 F (36.8 C), temperature source Temporal, resp. rate 16, height 5\' 11"  (1.803 m), weight 200 lb 9.6 oz (91 kg), SpO2 97 %.  Vitals with BMI 11/14/2019 07/03/2019 06/25/2019  Height 5\' 11"  5\' 11"  5\' 11"   Weight 200 lbs 10 oz 195 lbs 6 oz 194 lbs  BMI 27.99 0000000 [id number]  Systolic [id number]  Diastolic 56 60 60  Pulse 87 80 -     Physical Exam  Constitutional: He appears well-developed and well-nourished.  Cardiovascular: Normal rate, regular rhythm and intact distal pulses. Exam reveals no gallop.  Murmur heard.  Systolic murmur is present with a grade of 2/6 at the upper right sternal border. No leg edema, no JVD.  Pulmonary/Chest: Effort normal and breath sounds normal.  Abdominal: Soft. Bowel sounds are normal.   Laboratory examination:   External labs:   Cholesterol, total 138.000 M 03/06/2019 HDL 72.000 03/06/2019 LDL 54.000 03/06/2019 Triglycerides 59.000 03/06/2019  A1C 5.300 % 03/06/2019, TSH 3.400 09/26/2019  Creatinine, Serum 1.050 03/06/2019 Potassium 4.300 MM 11/20/2015 ALT (SGPT) 20.000 IU/ 03/06/2019  Medications and allergies  No  Known Allergies   Current Outpatient Medications  Medication Instructions  . aspirin EC 81 mg, Oral, Daily  . atorvastatin (LIPITOR) 10 mg, Oral, Daily  . levothyroxine (SYNTHROID) 50 mcg, Oral, Daily  . pantoprazole (PROTONIX) 40 mg, Oral, Daily  . Polyethyl Glycol-Propyl Glycol (SYSTANE FREE OP) 1 drop, Ophthalmic, As directed   Radiology:   No results found.  Cardiac Studies:   Event monitor 2 weeks, 03/15/2019: Short run of atrial tachycardia, rare PACs and PVCs.  Exercise nuclear stress test 06/28/2018:  The left ventricular ejection fraction is mildly decreased (45-54%). Nuclear stress EF: 53%. There was no ST segment deviation noted during stress. Blood pressure demonstrated a normal response to exercise. No T wave  inversion was noted during stress. The study is normal. This is a low risk study.  Echo- 04/24/2019  1. Normal LV systolic function with EF 55%. Left ventricle  cavity is normal in size. Moderate concentric hypertrophy  of the left ventricle. Normal global wall motion. Normal  diastolic filling pattern. Calculated EF 55%.  2. Mild (Grade I) mitral regurgitation.  3. IVC is normal with a respiratory response of <50%.  EKG 11/14/2019: Normal sinus rhythm with rate of 79 bpm, normal axis.  No evidence of ischemia, normal axis.   No significant change from  EKG 03/29/2019    Assessment     ICD-10-CM   1. Paroxysmal SVT (supraventricular tachycardia) (HCC)  I47.1 EKG 12-Lead  2. Coronary artery calcification seen on CT scan  I25.10   3. Pure hypercholesterolemia  E78.00     No orders of the defined types were placed in this encounter.   Medications Discontinued During This Encounter  Medication Reason  . levothyroxine (SYNTHROID) 25 MCG tablet Error    Recommendations:   Kenneth Ingram.  is a 74 y.o. Caucasian male with hyperlipidemia, OSA not on CPAP, former tobacco use, coronary calcification on CT scan previously seen by Dr. Gwenlyn Found and underwent exercise nuclear stress test in Oct 2019 that was considered low risk study presents for annual visit for palpitations and coronary atherosclerosis f/u.  He is asymptomatic, for very soft ejection systolic murmur no other abnormality.  I reviewed his echocardiogram performed recently.  I also reviewed external labs, lipids are under excellent control.  He has not had any further palpitations, suspect short run of PSVT was probably stress-induced.  Although he has coronary calcification in view of the fact that he has got all his risk factors well controlled no further evaluation is indicated.  I will see him back on a as needed basis.  Adrian Prows, MD, [facility] 11/14/2019, 10:32 AM [facility]. [place] Office: [phone]

## 2019-12-05 DIAGNOSIS — H02834 Dermatochalasis of left upper eyelid: Secondary | ICD-10-CM | POA: Diagnosis not present

## 2019-12-05 DIAGNOSIS — H02526 Blepharophimosis left eye, unspecified eyelid: Secondary | ICD-10-CM | POA: Diagnosis not present

## 2019-12-05 DIAGNOSIS — H02529 Blepharophimosis unspecified eye, unspecified lid: Secondary | ICD-10-CM | POA: Diagnosis not present

## 2019-12-05 DIAGNOSIS — H02831 Dermatochalasis of right upper eyelid: Secondary | ICD-10-CM | POA: Diagnosis not present

## 2020-03-18 DIAGNOSIS — Z125 Encounter for screening for malignant neoplasm of prostate: Secondary | ICD-10-CM | POA: Diagnosis not present

## 2020-03-18 DIAGNOSIS — E7800 Pure hypercholesterolemia, unspecified: Secondary | ICD-10-CM | POA: Diagnosis not present

## 2020-03-26 DIAGNOSIS — R7303 Prediabetes: Secondary | ICD-10-CM | POA: Diagnosis not present

## 2020-03-26 DIAGNOSIS — K2270 Barrett's esophagus without dysplasia: Secondary | ICD-10-CM | POA: Diagnosis not present

## 2020-03-26 DIAGNOSIS — I340 Nonrheumatic mitral (valve) insufficiency: Secondary | ICD-10-CM | POA: Diagnosis not present

## 2020-03-26 DIAGNOSIS — R292 Abnormal reflex: Secondary | ICD-10-CM | POA: Diagnosis not present

## 2020-03-26 DIAGNOSIS — Z7982 Long term (current) use of aspirin: Secondary | ICD-10-CM | POA: Diagnosis not present

## 2020-03-26 DIAGNOSIS — E7800 Pure hypercholesterolemia, unspecified: Secondary | ICD-10-CM | POA: Diagnosis not present

## 2020-03-26 DIAGNOSIS — I2510 Atherosclerotic heart disease of native coronary artery without angina pectoris: Secondary | ICD-10-CM | POA: Diagnosis not present

## 2020-03-26 DIAGNOSIS — Z0001 Encounter for general adult medical examination with abnormal findings: Secondary | ICD-10-CM | POA: Diagnosis not present

## 2020-03-26 DIAGNOSIS — D72819 Decreased white blood cell count, unspecified: Secondary | ICD-10-CM | POA: Diagnosis not present

## 2020-03-26 DIAGNOSIS — R5383 Other fatigue: Secondary | ICD-10-CM | POA: Diagnosis not present

## 2020-03-26 DIAGNOSIS — D692 Other nonthrombocytopenic purpura: Secondary | ICD-10-CM | POA: Diagnosis not present

## 2020-03-26 DIAGNOSIS — K219 Gastro-esophageal reflux disease without esophagitis: Secondary | ICD-10-CM | POA: Diagnosis not present

## 2020-04-03 DIAGNOSIS — Z23 Encounter for immunization: Secondary | ICD-10-CM | POA: Diagnosis not present

## 2020-04-27 DIAGNOSIS — L812 Freckles: Secondary | ICD-10-CM | POA: Diagnosis not present

## 2020-04-27 DIAGNOSIS — L821 Other seborrheic keratosis: Secondary | ICD-10-CM | POA: Diagnosis not present

## 2020-04-27 DIAGNOSIS — D692 Other nonthrombocytopenic purpura: Secondary | ICD-10-CM | POA: Diagnosis not present

## 2020-06-30 ENCOUNTER — Other Ambulatory Visit: Payer: Self-pay

## 2020-06-30 ENCOUNTER — Ambulatory Visit: Payer: Self-pay

## 2020-06-30 ENCOUNTER — Encounter: Payer: Self-pay | Admitting: Family Medicine

## 2020-06-30 ENCOUNTER — Ambulatory Visit: Payer: PPO | Admitting: Family Medicine

## 2020-06-30 VITALS — BP 118/62 | HR 76 | Ht 71.0 in | Wt 198.0 lb

## 2020-06-30 DIAGNOSIS — M25531 Pain in right wrist: Secondary | ICD-10-CM | POA: Diagnosis not present

## 2020-06-30 DIAGNOSIS — M25561 Pain in right knee: Secondary | ICD-10-CM | POA: Diagnosis not present

## 2020-06-30 DIAGNOSIS — M25562 Pain in left knee: Secondary | ICD-10-CM

## 2020-06-30 DIAGNOSIS — M7652 Patellar tendinitis, left knee: Secondary | ICD-10-CM | POA: Diagnosis not present

## 2020-06-30 DIAGNOSIS — M778 Other enthesopathies, not elsewhere classified: Secondary | ICD-10-CM | POA: Diagnosis not present

## 2020-06-30 DIAGNOSIS — E039 Hypothyroidism, unspecified: Secondary | ICD-10-CM | POA: Insufficient documentation

## 2020-06-30 DIAGNOSIS — M170 Bilateral primary osteoarthritis of knee: Secondary | ICD-10-CM

## 2020-06-30 DIAGNOSIS — M7651 Patellar tendinitis, right knee: Secondary | ICD-10-CM | POA: Diagnosis not present

## 2020-06-30 NOTE — Patient Instructions (Signed)
Thank you for coming in today.  Plan for Physical therapy.   I think you have knee arthritis, patellar tendonitis and wrist ECU (extensor carpi ulnaris) tendonitis.  Recheck in about 1 month.   If not better will do cortisone shot(s).   Communicate with me if you have questions or concerns.

## 2020-06-30 NOTE — Progress Notes (Signed)
Subjective:   I, Judy Pimple, am serving as a scribe for Dr. Lynne Leader.  CC: Bilateral Knee pain R>L  HPI: Patient is a 74 year old Male presenting to [place] at [facility] for Bilateral knee pain and right wrist pain.. Patient locates pain to R knee distal patella tendon  lateral knee L knee patella tendon and describes pain as sharp and stabbing.  Pain also located a bit at the medial joint line.  Patient has to limit what he does and cant do as much as he used to and wants to know what it going on. No pain with sitting. Works out weekly and the trainer does help with strength training but they do not over stress.  Also having R wrist pain that he hit something at the gym that he thought was soft and it was not. Did have an xray from [facility]. Fell in 2018 on a hunting trip where he caught himself with his hands. States picking up anything about 5 or more pounds will have pain. Locates pain to dorsal ulnar wrist.  Swelling: no Radiating: no  Aggravating symptoms: walking long distances; walking up hill  Tried: rest;   Pertinent review of Systems: No fevers or chills  Relevant historical information: hurt knees 50 years ago jumping out of planes CAD on aspirin and Lipitor.  Hypothyroidism on levothyroxine.   Objective:    Vitals:   06/30/20 0950  BP: 118/62  Pulse: 76  SpO2: 97%   General: Well Developed, well nourished, and in no acute distress.   MSK: Right wrist normal-appearing Normal motion.  During palpation dorsal ulnar wrist. Some pain with resisted wrist extension and ulnar deviation. Pulses cap refill and sensation are intact distally.  Right knee normal-appearing Normal motion with crepitation. Tender palpation distal patellar tendon and medial joint line. Stable ligamentous exam. Intact strength some pain with resisted knee extension. Negative McMurray test.  Left knee normal-appearing Normal motion with crepitation. Tender  palpation distal patellar tendon and medial joint line. Stable ligamentous exam. Intact strength some pain with resisted knee extension. Negative McMurray's test.  Lab and Radiology Results  Diagnostic Limited MSK Ultrasound of: Right wrist Hypoechoic fluid surrounds 6 dorsal wrist compartment at extensor carpi ulnaris tendon.  This is the area of maximum tenderness to palpation.  Other wise dorsal wrist is normal-appearing Impression: Extensor carpi ulnaris tendinitis  Diagnostic Limited MSK Ultrasound of: Bilateral knee Right knee: Quad tendon intact with mild hyperechoic change distal tendon insertion. No significant joint effusion superior patellar space. Patellar tendon intact normal-appearing Hyperechoic structure deep to distal patellar tendon superficial to Hoffa's fat pad consistent with infrapatellar bursitis Lateral joint line normal-appearing Medial joint line narrowed degenerative appearing Posterior knee no Baker's cyst.  Left knee: Quad tendon intact normal-appearing No significant joint effusion superior patellar space. Patellar tendon intact normal. Trace of small infrapatellar bursa present. Lateral joint line normal-appearing Medial joint line narrowed degenerative appearing Posterior knee no Baker's cyst.  Impression: Medial compartment DJD bilaterally. Bilateral deep infrapatellar bursitis right worse than left.   X-ray bilateral knee and right wrist images obtained 06/25/2019 personally and independently interpreted.  Main finding is medial compartment DJD bilateral  Impression and Recommendations:    Assessment and Plan: 74 y.o. male with bilateral knee pain predominantly due to deep infrapatellar bursitis right worse than left associated with medial compartment DJD.  He has not had a great trial with conservative management.  Recommend Voltaren gel and dedicated physical therapy.  Recheck in a month if not better consider targeted injection to the bursa.   Would also consider MRI to further characterize pain and swelling.  Right wrist pain consistent with extensor carpi ulnaris tendinitis.  Plan for again dedicated physical therapy and Voltaren gel.  Again could consider injection if not better.Marland Kitchen  PDMP not reviewed this encounter. Orders Placed This Encounter  Procedures   [place] LIMITED JOINT SPACE STRUCTURES LOW LEFT(NO LINKED CHARGES)    Standing Status:   Future    Number of Occurrences:   1    Standing Expiration Date:   06/30/2021    Order Specific Question:   Reason for Exam (SYMPTOM  OR DIAGNOSIS REQUIRED)    Answer:   Bilateral knee pain    Order Specific Question:   Preferred imaging location?    Answer:   [place]   Ambulatory referral to Physical Therapy    Referral Priority:   Routine    Referral Type:   Physical Medicine    Referral Reason:   Specialty Services Required    Requested Specialty:   Physical Therapy    Number of Visits Requested:   1   No orders of the defined types were placed in this encounter.   Discussed warning signs or symptoms. Please see discharge instructions. Patient expresses understanding.   The above documentation has been reviewed and is accurate and complete Lynne Leader, M.D.

## 2020-07-02 DIAGNOSIS — M25561 Pain in right knee: Secondary | ICD-10-CM | POA: Diagnosis not present

## 2020-07-02 DIAGNOSIS — M25562 Pain in left knee: Secondary | ICD-10-CM | POA: Diagnosis not present

## 2020-07-03 ENCOUNTER — Other Ambulatory Visit: Payer: Self-pay | Admitting: Gastroenterology

## 2020-07-03 DIAGNOSIS — K219 Gastro-esophageal reflux disease without esophagitis: Secondary | ICD-10-CM

## 2020-07-03 DIAGNOSIS — Z800 Family history of malignant neoplasm of digestive organs: Secondary | ICD-10-CM

## 2020-07-13 DIAGNOSIS — M25562 Pain in left knee: Secondary | ICD-10-CM | POA: Diagnosis not present

## 2020-07-13 DIAGNOSIS — M25561 Pain in right knee: Secondary | ICD-10-CM | POA: Diagnosis not present

## 2020-07-17 ENCOUNTER — Ambulatory Visit: Payer: PPO | Attending: Internal Medicine

## 2020-07-17 DIAGNOSIS — Z23 Encounter for immunization: Secondary | ICD-10-CM

## 2020-07-17 NOTE — Progress Notes (Signed)
   [id number]  Name:  Kenneth Ingram.    MRN: [id number] DOB: 23-Dec-1945  07/17/2020  [name] was observed post Covid-19 immunization for 15 minutes without incident. He was provided with Vaccine Information Sheet and instruction to access the V-Safe system.   [name] was instructed to call 911 with any severe reactions post vaccine: Marland Kitchen Difficulty breathing  . Swelling of face and throat  . A fast heartbeat  . A bad rash all over body  . Dizziness and weakness

## 2020-07-28 DIAGNOSIS — M25562 Pain in left knee: Secondary | ICD-10-CM | POA: Diagnosis not present

## 2020-07-28 DIAGNOSIS — M25561 Pain in right knee: Secondary | ICD-10-CM | POA: Diagnosis not present

## 2020-07-29 NOTE — Progress Notes (Signed)
I, Wendy Poet, LAT, ATC, am serving as scribe for Dr. Lynne Leader.  Kenneth Ingram. is a 74 y.o. male who presents to [place] at [facility] today for f/u of B knee pain and R wrist pain.  [name] on 06/30/20 and was referred to PT at [facility] and advised to use Voltaren gel. Since his last visit, pt reports R knee is still hurting. Increased pn w/ walking. Works out w/ a Physiological scientist. C/o clicking in the R knee, on the medial aspect. Pt is attending PT. L knee is not bothering him, just every so often. Pt does not really want a cortisone shot because it's just masking the pn. Wrist is not feeling good. Wrist extension increases pn. Unable push ups, pain holding shot gun, pn holding coffee cup.  Diagnostic testing: R and L knee XR, R wrist XR- 06/25/19  Pertinent review of systems: No fevers or chills  Relevant historical information: Hypothyroidism, hyperlipidemia   Exam:  BP 132/76 (BP Location: Right Arm, Patient Position: Sitting, Cuff Size: Normal)   Pulse 72   Ht 5\' 11"  (1.803 m)   Wt 201 lb 3.2 oz (91.3 kg)   SpO2 96%   BMI 28.06 kg/m  General: Well Developed, well nourished, and in no acute distress.   MSK: Right wrist normal-appearing normal motion. Lacks full extension.  Right knee normal motion. Mild painful gait.    Lab and Radiology Results X-ray images right wrist and right knee obtained today personally and independently interpreted  Right wrist: No acute fractures. Degenerative changes present in wrist. No significant change in appearance compared to previous wrist x-ray October 2020  Right knee: Mild degenerative changes predominantly at patellofemoral joint. No acute fractures.  Await formal radiology review     Assessment and Plan: 74 y.o. male with right wrist pain and lack of full extension. Very likely dorsal wrist impingement. Patient maximizing conservative management. We will add  compression sleeve which should help to his existing treatment of physical therapy and home exercise program. Probably would have some benefit from steroid injection but patient would like to delay which is reasonable. Neck step if needed would be MRI arthrogram to further characterize cause of pain and for potential surgical planning. Recheck in 3 months or check back sooner if needed.  Right knee pain: No severe degenerative change present on x-ray today. Could proceed with steroid injection in addition to his ongoing physical therapy. Again he would like to delay that if possible. Next step would be MRI if needed for injection or surgical planning. Recheck back in 3 months or sooner if needed.   PDMP not reviewed this encounter. Orders Placed This Encounter  Procedures  . DG Knee AP/LAT W/Sunrise Right    Standing Status:   Future    Number of Occurrences:   1    Standing Expiration Date:   07/30/2021    Order Specific Question:   Reason for Exam (SYMPTOM  OR DIAGNOSIS REQUIRED)    Answer:   eval knee pain    Order Specific Question:   Preferred imaging location?    Answer:   [facility]  . DG Wrist Complete Right    Standing Status:   Future    Number of Occurrences:   1    Standing Expiration Date:   07/30/2021    Order Specific Question:   Reason for Exam (SYMPTOM  OR DIAGNOSIS REQUIRED)  Answer:   eval wrist pain    Order Specific Question:   Preferred imaging location?    Answer:   [facility]   No orders of the defined types were placed in this encounter.    Discussed warning signs or symptoms. Please see discharge instructions. Patient expresses understanding.   The above documentation has been reviewed and is accurate and complete Lynne Leader, M.D.

## 2020-07-30 ENCOUNTER — Ambulatory Visit: Payer: Self-pay

## 2020-07-30 ENCOUNTER — Other Ambulatory Visit: Payer: Self-pay

## 2020-07-30 ENCOUNTER — Ambulatory Visit: Payer: PPO | Admitting: Family Medicine

## 2020-07-30 ENCOUNTER — Ambulatory Visit (INDEPENDENT_AMBULATORY_CARE_PROVIDER_SITE_OTHER): Payer: PPO

## 2020-07-30 ENCOUNTER — Encounter: Payer: Self-pay | Admitting: Family Medicine

## 2020-07-30 VITALS — BP 132/76 | HR 72 | Ht 71.0 in | Wt 201.2 lb

## 2020-07-30 DIAGNOSIS — M25561 Pain in right knee: Secondary | ICD-10-CM

## 2020-07-30 DIAGNOSIS — G8929 Other chronic pain: Secondary | ICD-10-CM

## 2020-07-30 DIAGNOSIS — M19031 Primary osteoarthritis, right wrist: Secondary | ICD-10-CM | POA: Diagnosis not present

## 2020-07-30 DIAGNOSIS — M25531 Pain in right wrist: Secondary | ICD-10-CM

## 2020-07-30 NOTE — Patient Instructions (Signed)
Thank you for coming in today.  Plan for xrays today.   Continue home exercises and PT.   I recommend you obtained a compression sleeve to help with your joint problems. There are many options on the market however I recommend obtaining a Full wrist Body Helix compression sleeve.  You can find information (including how to appropriate measure yourself for sizing) can be found at www.Body http://www.lambert.com/.  Many of these products are health savings account (HSA) eligible.   You can use the compression sleeve at any time throughout the day but is most important to use while being active as well as for 2 hours post-activity.   It is appropriate to ice following activity with the compression sleeve in place.   Continue the voltaren gel as needed.   Recheck in 3 months.  Let me know sooner if you want to proceed to MRI or injection.

## 2020-07-31 NOTE — Progress Notes (Signed)
Knee x-ray is surprisingly normal-appearing.  You have a little bit of arthritis but otherwise it looks pretty normal to radiology.  We may consider an earlier MRI of the knee to evaluate causes of pain.  Please let me know if you would like to proceed with this.

## 2020-07-31 NOTE — Progress Notes (Signed)
Arthritis is present in the wrist at the junction between 2 of the wrist bones.  This is stable appearing compared to prior wrist x-ray a year ago.  Additionally you have some arthritis at the base of the thumb.

## 2020-08-11 DIAGNOSIS — D3132 Benign neoplasm of left choroid: Secondary | ICD-10-CM | POA: Diagnosis not present

## 2020-08-11 DIAGNOSIS — H5213 Myopia, bilateral: Secondary | ICD-10-CM | POA: Diagnosis not present

## 2020-08-11 DIAGNOSIS — Z961 Presence of intraocular lens: Secondary | ICD-10-CM | POA: Diagnosis not present

## 2020-10-09 ENCOUNTER — Other Ambulatory Visit: Payer: Self-pay | Admitting: Gastroenterology

## 2020-10-09 DIAGNOSIS — Z800 Family history of malignant neoplasm of digestive organs: Secondary | ICD-10-CM

## 2020-10-09 DIAGNOSIS — K219 Gastro-esophageal reflux disease without esophagitis: Secondary | ICD-10-CM

## 2020-10-26 NOTE — Progress Notes (Signed)
I, Kenneth Ingram, LAT, ATC, am serving as scribe for Dr. Lynne Leader.  Kenneth Ingram. is a 75 y.o. male who presents to [place] at [facility] today for f/u of B knee pain and R wrist pain.  [name] on 85/27/78 and noted clicking on the medial aspect of his R knee and R wrist pain irritated w/ loaded wrist extension.  He was advised to con't PT at [facility], to purchase a knee and wrist compression sleeve and to use Voltaren gel.  Since his last visit, pt reports that he remains about the same.  He has completed PT and con't to do his HEP in conjunction w/ his workout routine at the gym.  He has not purchased any type of compression or brace.   Diagnostic testing: R wrist and R knee XR- 07/30/20  Pertinent review of systems: No fevers or chills  Relevant historical information: Hyperlipidemia and hypothyroidism   Exam:  BP 110/76 (BP Location: Right Arm, Patient Position: Sitting, Cuff Size: Large)   Pulse 85   Ht 5\' 11"  (1.803 m)   Wt 203 lb 3.2 oz (92.2 kg)   SpO2 96%   BMI 28.34 kg/m  General: Well Developed, well nourished, and in no acute distress.   MSK: Right wrist slight swelling otherwise normal.  Normal motion. Right knee normal motion.    Lab and Radiology Results EXAM: RIGHT WRIST - COMPLETE 3+ VIEW  COMPARISON:  06/25/2019 right wrist radiographs  FINDINGS: No fracture or dislocation. No suspicious focal osseous lesions. Mild-to-moderate osteoarthritis at the lunate-capitate and lunate-hamate articulations. Mild first carpometacarpal joint osteoarthritis. No radiopaque foreign bodies.  IMPRESSION: 1. Mild-to-moderate osteoarthritis at the lunate-capitate and lunate-hamate articulations. 2. Mild first carpometacarpal joint osteoarthritis.   Electronically Signed   By: Ilona Sorrel M.D.   On: 07/30/2020 16:49  EXAM: RIGHT KNEE 3 VIEWS  COMPARISON:  June 25, 2019  FINDINGS: Frontal,  lateral, and sunrise patellar images were obtained. No evident fracture, dislocation, or joint effusion slight joint space narrowing medially is stable. Other joint spaces appear unremarkable and stable. A spur is noted along the anterior patella superiorly. No erosion.  IMPRESSION: Stable slight narrowing medially. No fracture, dislocation, or joint effusion. A spur along the anterior superior patella likely represents distal quadriceps tendinosis.   Electronically Signed   By: Lowella Grip III M.D.   On: 07/30/2020 17:00 I, Lynne Leader, personally (independently) visualized and performed the interpretation of the images attached in this note.     Assessment and Plan: 75 y.o. male with right wrist and knee pain.  Both are thought to be due to degenerative changes.  Patient is doing reasonably well with conservative management.  Certainly there could be more to do here.  Wrist: Next step would probably be MRI arthrogram as patient is already had trial of physical therapy.  He would like to hold off on that for now.  He will let me know when he is ready to proceed with further treatment.  Knee pain: Thought to be due to DJD.  Again next step is either MRI or steroid injection.  He is more inclined to proceed with steroid injection prior to a hunting trip later this year.  I think that is a great idea and happy to do what he thinks is best for this.  Discussed some pragmatic options as well.  Recheck back with me as needed.    Discussed warning signs or  symptoms. Please see discharge instructions. Patient expresses understanding.   The above documentation has been reviewed and is accurate and complete Lynne Leader, M.D.  Total encounter time 20 minutes including face-to-face time with the patient and, reviewing past medical record, and charting on the date of service.

## 2020-10-27 ENCOUNTER — Other Ambulatory Visit: Payer: Self-pay

## 2020-10-27 ENCOUNTER — Encounter: Payer: Self-pay | Admitting: Family Medicine

## 2020-10-27 ENCOUNTER — Ambulatory Visit: Payer: PPO | Admitting: Family Medicine

## 2020-10-27 VITALS — BP 110/76 | HR 85 | Ht 71.0 in | Wt 203.2 lb

## 2020-10-27 DIAGNOSIS — M25561 Pain in right knee: Secondary | ICD-10-CM

## 2020-10-27 DIAGNOSIS — M25531 Pain in right wrist: Secondary | ICD-10-CM

## 2020-10-27 DIAGNOSIS — G8929 Other chronic pain: Secondary | ICD-10-CM

## 2020-10-27 NOTE — Patient Instructions (Signed)
Thank you for coming in today.  Let me know if you need anything.   Next step for wrist is MRI arthrogram.   Next step for knees is injection as needed. A good time to do the injection is about 1 week before a big trip.

## 2020-11-07 ENCOUNTER — Other Ambulatory Visit: Payer: Self-pay | Admitting: Gastroenterology

## 2020-11-07 DIAGNOSIS — Z800 Family history of malignant neoplasm of digestive organs: Secondary | ICD-10-CM

## 2020-11-07 DIAGNOSIS — K219 Gastro-esophageal reflux disease without esophagitis: Secondary | ICD-10-CM

## 2020-12-07 ENCOUNTER — Other Ambulatory Visit: Payer: Self-pay | Admitting: Gastroenterology

## 2020-12-07 DIAGNOSIS — K219 Gastro-esophageal reflux disease without esophagitis: Secondary | ICD-10-CM

## 2020-12-07 DIAGNOSIS — Z800 Family history of malignant neoplasm of digestive organs: Secondary | ICD-10-CM

## 2020-12-09 ENCOUNTER — Encounter: Payer: Self-pay | Admitting: Nurse Practitioner

## 2020-12-09 ENCOUNTER — Ambulatory Visit: Payer: PPO | Admitting: Nurse Practitioner

## 2020-12-09 DIAGNOSIS — K219 Gastro-esophageal reflux disease without esophagitis: Secondary | ICD-10-CM

## 2020-12-09 MED ORDER — PANTOPRAZOLE SODIUM 20 MG PO TBEC: DELAYED_RELEASE_TABLET | ORAL | 6 refills | Status: DC

## 2020-12-09 NOTE — Progress Notes (Signed)
ASSESSMENT AND PLAN    #75 year old male with longstanding GERD. Irregular Z-line on prior EGDs but esophageal biopsies have not shown Barrett's esophagus.  Patient's mother had esophageal cancer and he is quite anxious about the possibility of that for himself.  Therefore he is scheduled for a 5-year interval EGD in September 2023 --Patient is still on 40 mg of Pantoprazole daily, he doesn't recall ever reducing the dose to 20 mg daily as discussed with Dr. Havery Moros ( following conversation about potential side effects of long-term PPI use ). He is interested now in reducing the dose.  Will refill Protonix at 20 mg. For recurrent GERD symptoms he will increase dose back to 40 mg in the evening.    # Colon cancer screening.  Patient had a screening colonoscopy in 2018.  The exam was complete with a good prep.  No polyps or cancers.  Patient inquires about proceeding with colonoscopy at the time of his EGD next September.  He is fearful about waiting 10 years from the date of his last colonoscopy as he has had many friends be unexpectedly diagnosed with cancer.  --We discussed recommended colon cancer screening guidelines and the reasons that they exist.  However, patient understands that in the future should he develop any signs of colon cancer such as bowel changes, rectal bleeding or anemia then that may warrant a sooner colonoscopy  HISTORY OF PRESENT ILLNESS    Chief Complaint : Refill on pantoprazole, discussed colonoscopy  Kenneth Ingram. is a 75 y.o. male known to Dr. Havery Moros with past medical history of a hiatal hernia, GERD, hypothyroidism, hyperlipidemia  Patient was last seen in the office October 2020 at which time his GERD symptoms were well controlled on daily Protonix.  They discussed potential long-term side effects of PPI, patient was to try cutting the dose of Protonix in half to see if that would control reflux symptoms.  Because of his mother history of esophageal  cancer patient was anxious about his own risk of esophageal cancer. He was felt to be at low risk for esophageal cancer but proceeding with a 5-year interval EGD was discussed.  He is on the schedule for recall EGD in 2023  INTERVAL HISTORY:  Patient is still on 40 mg of Pantoprazole daily, he doesn't recall ever reducing the dose to 20 mg daily as discussed with Dr. Havery Moros at last visit but he is interested in doing that now. GERD symptoms are well controlled on pantoprazole 40 mg Q pm, even if he eats spicy food. No dysphagia. Patient has no GI complaints.   PREVIOUS ENDOSCOPIC EVALUATIONS / PERTINENT STUDIES:   EGD 2008 - reportedly had Barrett's esophagus - records not available  EGD 03/23/2012 - irregular z-line, small hiatal hernia - biopsies negative for Barrett's  EGD 05/29/2017 - Esophagogastric landmarks were identified: the Z-line was found at 40 cm, the gastroesophageal junction was found at 40 cm and the upper extent of the gastric folds was found at 43 cm from the incisors. Findings: - A 3 cm hiatal hernia was present. - The Z-line was irregular and was found 40 cm from the incisors, one tongue of salmon colored mucosa without nodularity noted, roughly 36mm to 33mm in length without nodularity . Biopsies were taken with a cold forceps for histology. - 2 areas of suspected ectopic gastric mucosa were found in the upper third of the esophagus, around 3mm from the incisors. One of the areas was small (few mm) while  the other was larger than typically noted ectopic gastric mucosa (perhaps 2cm in length). Biopsies were taken with a cold forceps for histology to ensure no Barrett's esophagus. - The exam of the esophagus was otherwise normal. - The entire examined stomach was normal. - The duodenal bulb and second portion of the duodenum were normal.  *No evidence of Barrett's esophagus on path.   2018 screening colonoscopy September 2018 screening colonoscopy --Complete  exam, good prep --Internal hemorrhoids  Past Medical History:  Diagnosis Date  . Cataract    bil cateracts removed 2008  . GERD (gastroesophageal reflux disease)   . Hyperlipidemia   . Palpitations 03/29/2019  . Sleep apnea    pt does not wear a c-pap  . Vertigo     Current Medications, Allergies, Past Surgical History, Family History and Social History were reviewed in [employer] record.   Current Outpatient Medications  Medication Sig Dispense Refill  . aspirin EC 81 MG tablet Take 81 mg by mouth daily.    Marland Kitchen atorvastatin (LIPITOR) 10 MG tablet Take 10 mg by mouth daily.    Marland Kitchen levothyroxine (SYNTHROID) 50 MCG tablet Take 50 mcg by mouth daily.    . pantoprazole (PROTONIX) 40 MG tablet TAKE ONE TABLET BY MOUTH DAILY *PLEASE SCHEDULE YEARLY FOLLOW UP APPOINTMENT FOR FURTHER REFILLS* 30 tablet 0  . Polyethyl Glycol-Propyl Glycol (SYSTANE FREE OP) Apply 1 drop to eye as directed.     No current facility-administered medications for this visit.    Review of Systems: No chest pain. No shortness of breath. No urinary complaints.   PHYSICAL EXAM :    Wt Readings from Last 3 Encounters:  12/09/20 204 lb (92.5 kg)  10/27/20 203 lb 3.2 oz (92.2 kg)  07/30/20 201 lb 3.2 oz (91.3 kg)    BP 114/62   Pulse 70   Ht 5\' 11"  (1.803 m)   Wt 204 lb (92.5 kg)   SpO2 96%   BMI 28.45 kg/m  Constitutional:  Pleasant well developed male in no acute distress. Psychiatric: Normal mood and affect. Behavior is normal. EENT: Pupils normal.  Conjunctivae are normal. No scleral icterus. Neck supple.  Cardiovascular: Normal rate, regular rhythm. No edema Pulmonary/chest: Effort normal and breath sounds normal. No wheezing, rales or rhonchi. Abdominal: Soft, nondistended, nontender. Bowel sounds active throughout. There are no masses palpable. No hepatomegaly. Neurological: Alert and oriented to person place and time. Skin: Skin is warm and dry. No rashes noted.   I spent  30 minutes total reviewing records, obtaining history, performing exam, counseling patient and documenting visit / findings.   Kenneth Savoy, NP  12/09/2020, 2:04 PM

## 2020-12-09 NOTE — Patient Instructions (Signed)
If you are age 73 or older, your body mass index should be between 23-30. Your Body mass index is 28.45 kg/m. If this is out of the aforementioned range listed, please consider follow up with your Primary Care Provider.  If you are age 28 or younger, your body mass index should be between 19-25. Your Body mass index is 28.45 kg/m. If this is out of the aformentioned range listed, please consider follow up with your Primary Care Provider.   DECREASE your Pantoprazole to 20 mg 1 tablet every evening. We have sent this to your pharmacy.  Call the office and speak with Paula's nurse if your GERD symptoms on the reduced dose get worse and we will have you go back to Pantoprazole 40 mg.  Thank you for entrusting me with your care and choosing [facility].  Tye Savoy, NP-C

## 2020-12-09 NOTE — Progress Notes (Signed)
Agree with assessment and plan as outlined.  

## 2021-03-24 DIAGNOSIS — R7303 Prediabetes: Secondary | ICD-10-CM | POA: Diagnosis not present

## 2021-03-24 DIAGNOSIS — E7801 Familial hypercholesterolemia: Secondary | ICD-10-CM | POA: Diagnosis not present

## 2021-03-24 DIAGNOSIS — Z125 Encounter for screening for malignant neoplasm of prostate: Secondary | ICD-10-CM | POA: Diagnosis not present

## 2021-03-24 DIAGNOSIS — E039 Hypothyroidism, unspecified: Secondary | ICD-10-CM | POA: Diagnosis not present

## 2021-03-31 DIAGNOSIS — Z0001 Encounter for general adult medical examination with abnormal findings: Secondary | ICD-10-CM | POA: Diagnosis not present

## 2021-03-31 DIAGNOSIS — D72819 Decreased white blood cell count, unspecified: Secondary | ICD-10-CM | POA: Diagnosis not present

## 2021-03-31 DIAGNOSIS — I2510 Atherosclerotic heart disease of native coronary artery without angina pectoris: Secondary | ICD-10-CM | POA: Diagnosis not present

## 2021-03-31 DIAGNOSIS — E039 Hypothyroidism, unspecified: Secondary | ICD-10-CM | POA: Diagnosis not present

## 2021-03-31 DIAGNOSIS — G4733 Obstructive sleep apnea (adult) (pediatric): Secondary | ICD-10-CM | POA: Diagnosis not present

## 2021-03-31 DIAGNOSIS — K219 Gastro-esophageal reflux disease without esophagitis: Secondary | ICD-10-CM | POA: Diagnosis not present

## 2021-04-27 DIAGNOSIS — L812 Freckles: Secondary | ICD-10-CM | POA: Diagnosis not present

## 2021-04-27 DIAGNOSIS — L821 Other seborrheic keratosis: Secondary | ICD-10-CM | POA: Diagnosis not present

## 2021-05-06 ENCOUNTER — Telehealth: Payer: Self-pay | Admitting: Nurse Practitioner

## 2021-05-06 ENCOUNTER — Other Ambulatory Visit: Payer: Self-pay

## 2021-05-06 DIAGNOSIS — K219 Gastro-esophageal reflux disease without esophagitis: Secondary | ICD-10-CM

## 2021-05-06 MED ORDER — PANTOPRAZOLE SODIUM 40 MG PO TBEC: 40.0000 mg | DELAYED_RELEASE_TABLET | Freq: Every day | ORAL | 3 refills | Status: AC

## 2021-05-06 NOTE — Telephone Encounter (Signed)
Outpatient Medication Detail   Disp Refills Start End   pantoprazole (PROTONIX) 40 MG tablet 90 tablet 3 05/06/2021    Sig - Route: Take 1 tablet (40 mg total) by mouth daily. - Oral   Sent to pharmacy as: pantoprazole (PROTONIX) 40 MG tablet   E-Prescribing Status: Receipt confirmed by pharmacy (05/06/2021 11:33 AM EDT)

## 2021-05-06 NOTE — Telephone Encounter (Signed)
Patient calling requesting med Protonix mg be changed back to original dose. Pt states he is experiencing a flare up.. Plz advise thank you

## 2021-06-09 DIAGNOSIS — M4316 Spondylolisthesis, lumbar region: Secondary | ICD-10-CM | POA: Diagnosis not present

## 2021-06-22 DIAGNOSIS — M4316 Spondylolisthesis, lumbar region: Secondary | ICD-10-CM | POA: Diagnosis not present

## 2021-06-22 DIAGNOSIS — M545 Low back pain, unspecified: Secondary | ICD-10-CM | POA: Diagnosis not present

## 2021-07-01 DIAGNOSIS — Z6826 Body mass index (BMI) 26.0-26.9, adult: Secondary | ICD-10-CM | POA: Diagnosis not present

## 2021-07-01 DIAGNOSIS — M4316 Spondylolisthesis, lumbar region: Secondary | ICD-10-CM | POA: Diagnosis not present

## 2021-08-18 DIAGNOSIS — Z961 Presence of intraocular lens: Secondary | ICD-10-CM | POA: Diagnosis not present

## 2021-08-18 DIAGNOSIS — H5213 Myopia, bilateral: Secondary | ICD-10-CM | POA: Diagnosis not present

## 2021-08-18 DIAGNOSIS — D3132 Benign neoplasm of left choroid: Secondary | ICD-10-CM | POA: Diagnosis not present

## 2021-09-30 DIAGNOSIS — T148XXA Other injury of unspecified body region, initial encounter: Secondary | ICD-10-CM | POA: Diagnosis not present

## 2021-12-29 DIAGNOSIS — M545 Low back pain, unspecified: Secondary | ICD-10-CM | POA: Diagnosis not present

## 2021-12-29 DIAGNOSIS — R262 Difficulty in walking, not elsewhere classified: Secondary | ICD-10-CM | POA: Diagnosis not present

## 2021-12-30 DIAGNOSIS — R262 Difficulty in walking, not elsewhere classified: Secondary | ICD-10-CM | POA: Diagnosis not present

## 2021-12-30 DIAGNOSIS — M545 Low back pain, unspecified: Secondary | ICD-10-CM | POA: Diagnosis not present

## 2022-01-12 IMAGING — DX DG KNEE AP/LAT W/ SUNRISE*R*
3 series · 3 of 3 positions shown · non-contrast
Comparison: June 25, 2019

CLINICAL DATA: Pain

EXAM:
RIGHT KNEE 3 VIEWS

[knee ap]
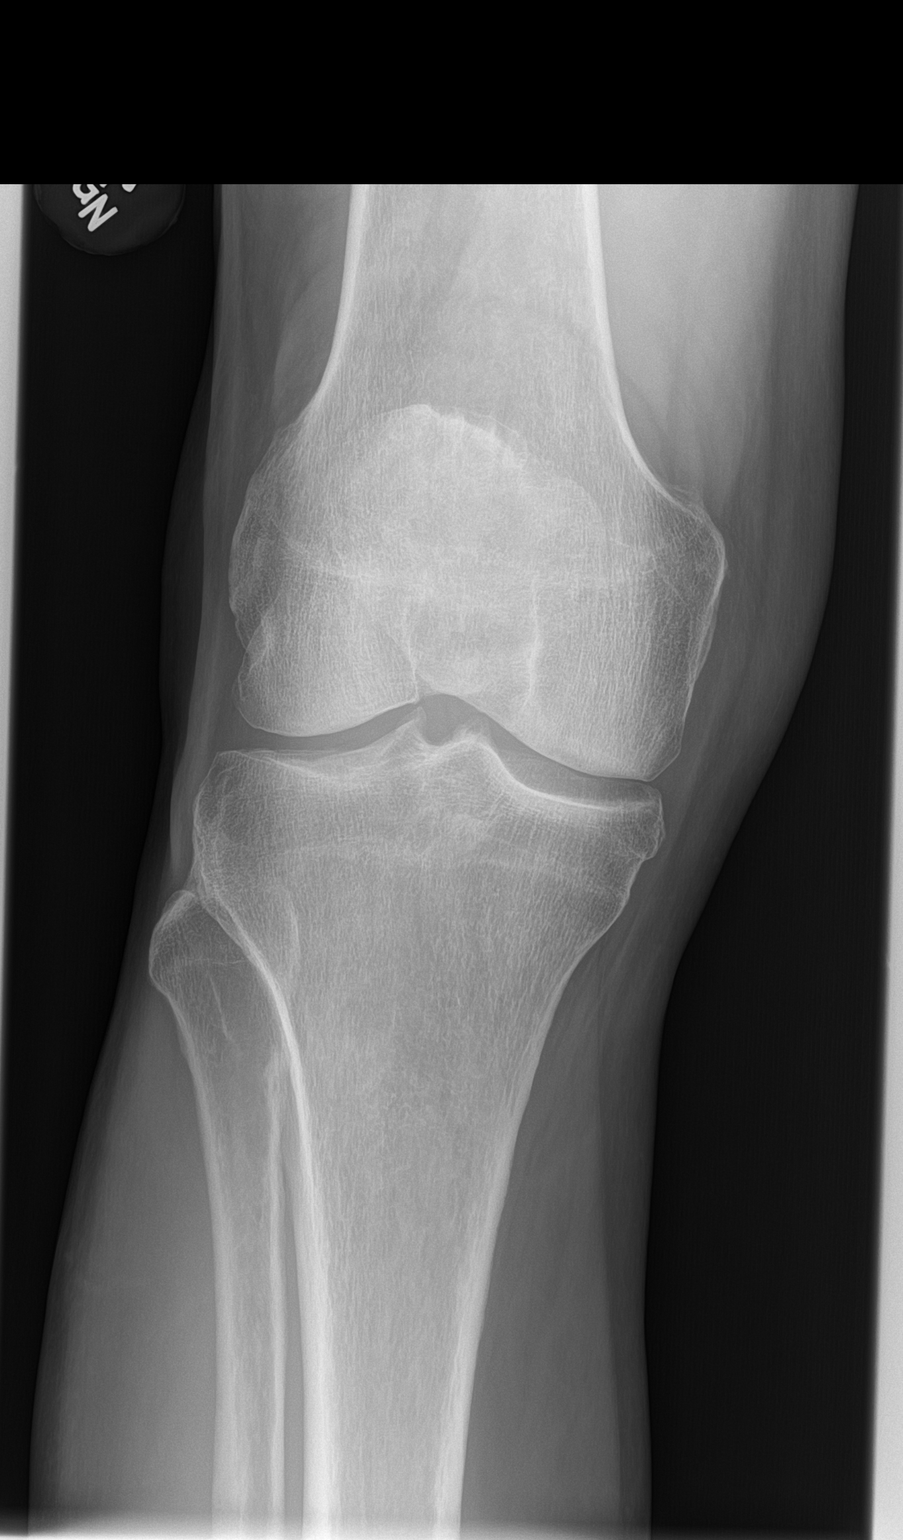

[knee lat]
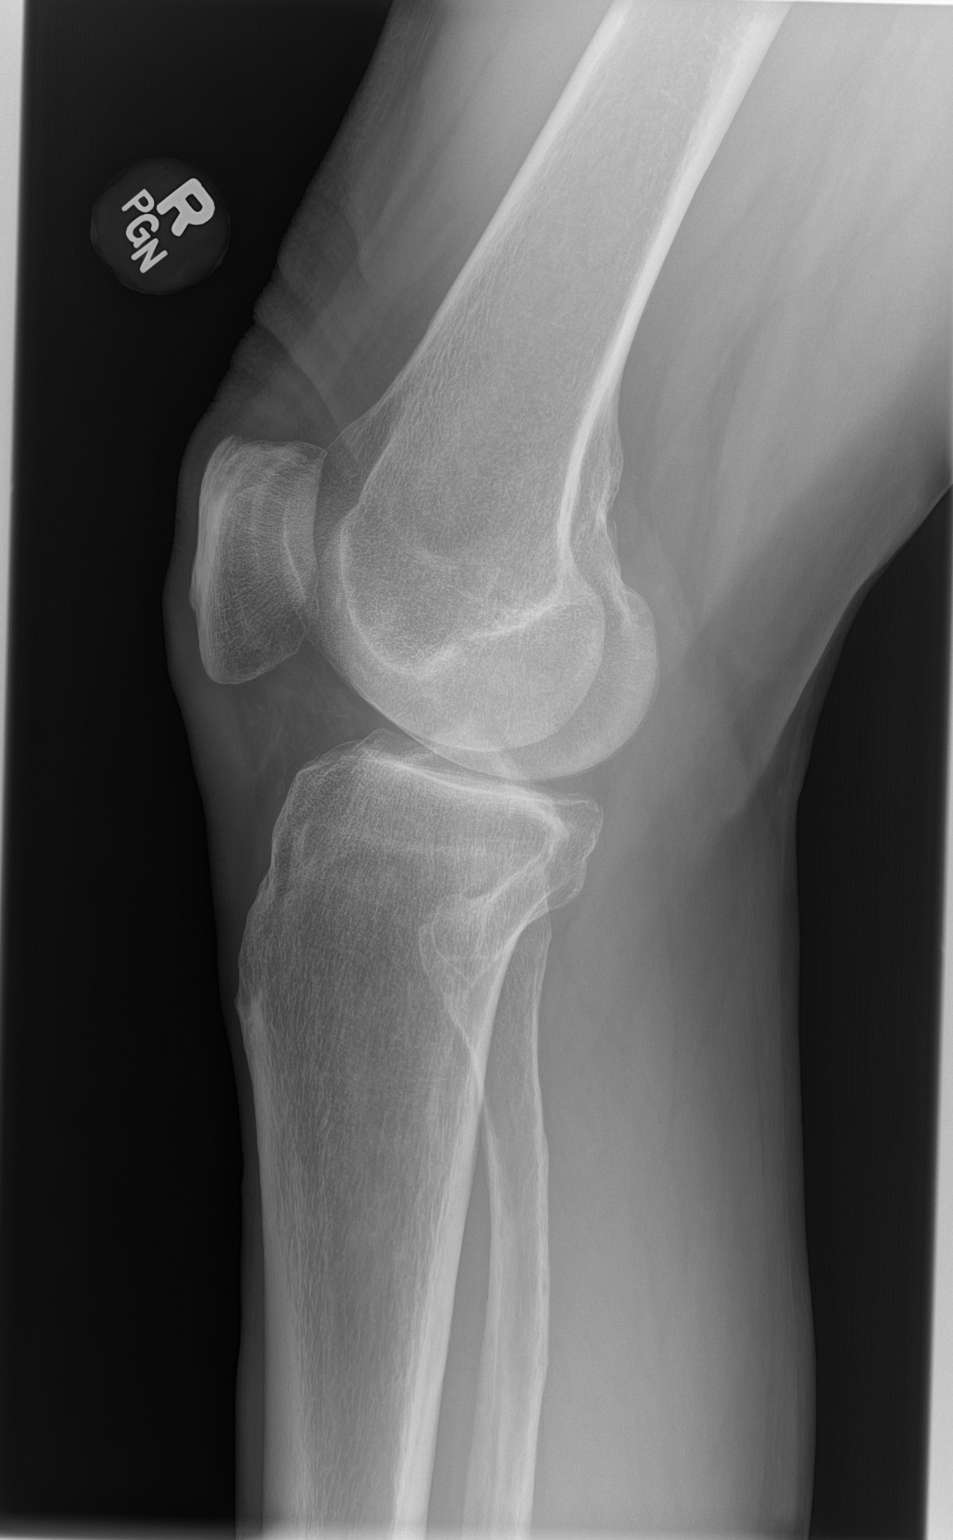

[patella]
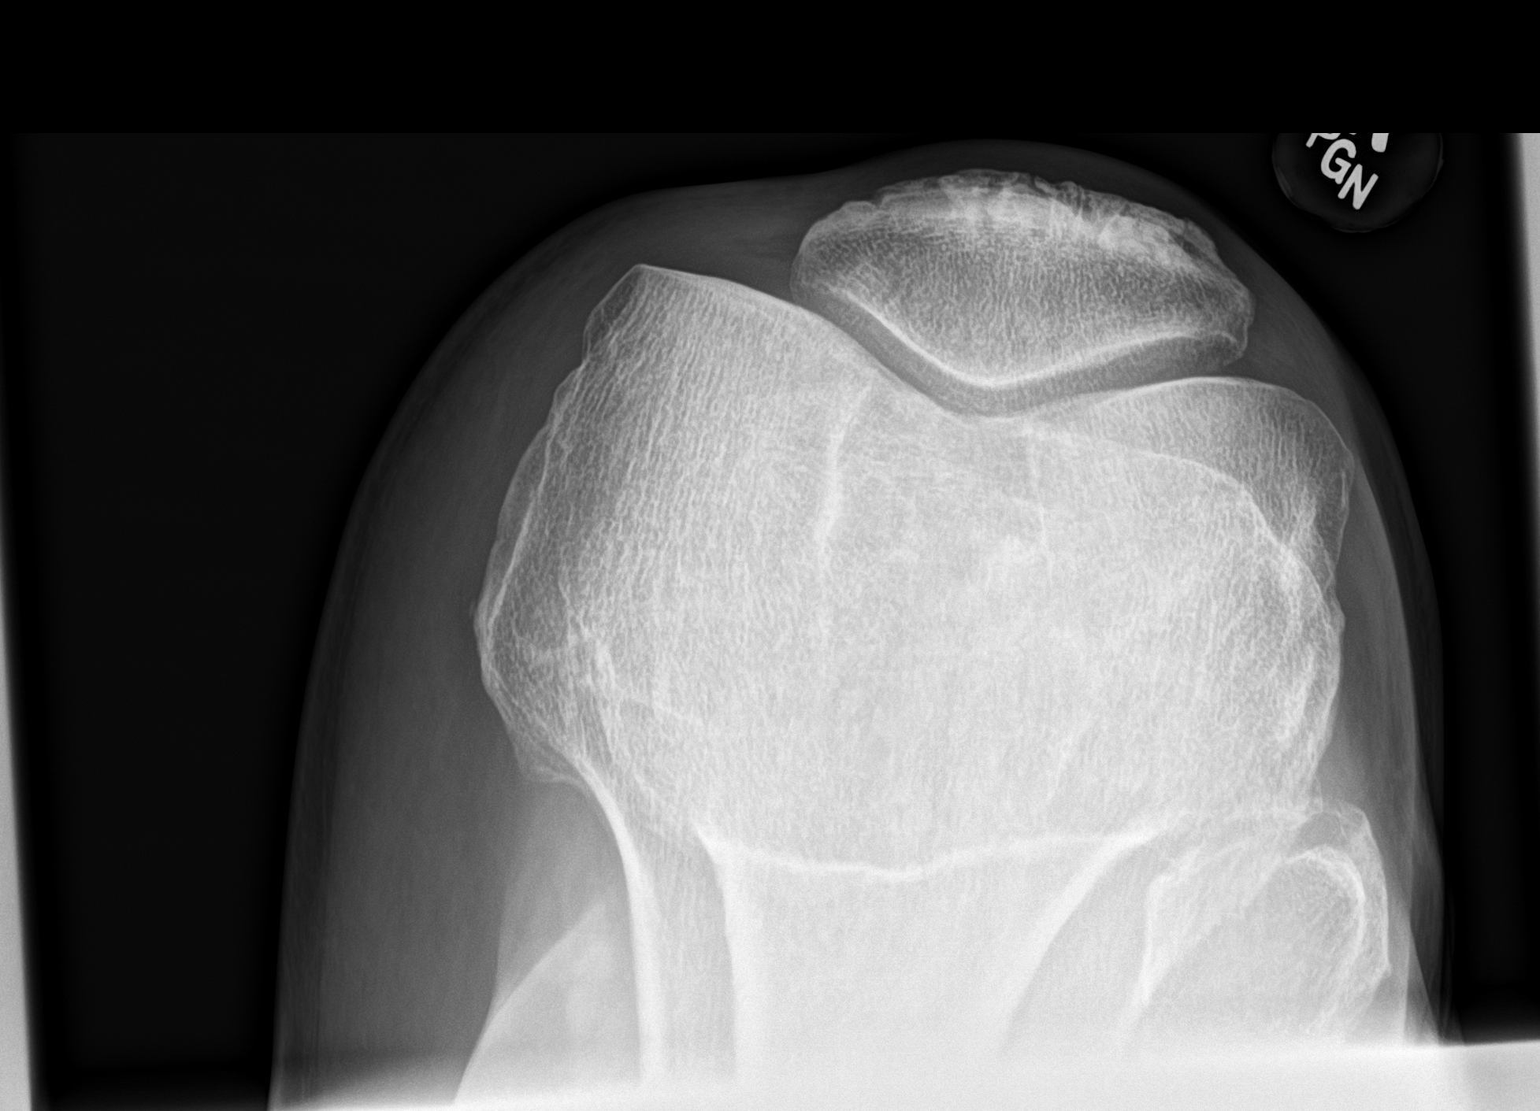

[3 of 3 positions shown; findings below may reference images not displayed]

FINDINGS: Frontal, lateral, and sunrise patellar images were obtained. No
evident fracture, dislocation, or joint effusion slight joint space
narrowing medially is stable. Other joint spaces appear unremarkable
and stable. A spur is noted along the anterior patella superiorly.
No erosion.
IMPRESSION: Stable slight narrowing medially. No fracture, dislocation, or joint
effusion. A spur along the anterior superior patella likely
represents distal quadriceps tendinosis.

## 2022-03-04 ENCOUNTER — Encounter: Payer: Self-pay | Admitting: Nurse Practitioner

## 2022-03-04 ENCOUNTER — Ambulatory Visit: Payer: PPO | Admitting: Nurse Practitioner

## 2022-03-04 VITALS — BP 118/66 | HR 61 | Ht 71.0 in | Wt 197.4 lb

## 2022-03-04 DIAGNOSIS — K219 Gastro-esophageal reflux disease without esophagitis: Secondary | ICD-10-CM | POA: Diagnosis not present

## 2022-03-04 NOTE — Patient Instructions (Signed)
If you are age 74 or older, your body mass index should be between 23-30. Your Body mass index is 27.53 kg/m. If this is out of the aforementioned range listed, please consider follow up with your Primary Care Provider. ________________________________________________________  The [facility] providers would like to encourage you to use [facility] to communicate with providers for non-urgent requests or questions.  Due to long hold times on the telephone, sending your provider a message by [facility] may be a faster and more efficient way to get a response.  Please allow 48 business hours for a response.  Please remember that this is for non-urgent requests.  _______________________________________________________  Kenneth Ingram will follow up in our office on an as needed basis.  Thank you for entrusting me with your care and choosing [facility].  Kenneth Savoy, NP

## 2022-03-04 NOTE — Progress Notes (Signed)
Agree with assessment and plan as outlined.  He does not need another colonoscopy in my opinion, his recall should have been for 10 years, not 5, given his last exam was normal.  At the age of 42 I do not feel he warrants any routine screening colonoscopy given his last exam was normal.  Thanks

## 2022-03-04 NOTE — Progress Notes (Signed)
Assessment   Patient Profile:  Kenneth Roots. is a 76 y.o. male known to Dr. Havery Moros with a past medical history of a hiatal hernia, GERD with irregular Z line without Barrett's on biopsies, hypothyroidism, hyperlipidemia.  Additional medical history as listed in [place] .  Chronic GERD. Irregular Z line but biopsies negative for Barrett's Had been doing well after reducing Pantoprazole from 40 to 20 mg.  However he recently had a flare in symptoms after consumption of red sauce and wine. Additionally , he drinks a lot of coffee  [facility] of esophageal cancer in mother.   Colon cancer screening. Negative exam in 2018. Would be 10 years ( 2028) before next screening is due at which time he will be 81. However, he is on the colonoscopy recall list for Sept 2023? Patient is okay doing the colonoscopy in Sept 2023 if Dr. Havery Moros would like it done. He has no alarm features and no FMH of colon cancer.    Plan   Discussed anti-reflux measures such as avoidance of late meals / bedtime snacks, HOB elevation (or use of wedge pillow), weight reduction ( if applicable)  / maintaining a healthy BMI ( body mass index),  and avoidance of trigger foods and caffeine.  For now would continue 40 mg of Pantoprazole. He will work on decreasing caffeine consumption and avoidance of other trigger foods He is on for 5 years recall EGD given history of esophageal cancer in mother    HPI   Chief Complaint : GERD  12/09/20 Last office visit Pantoprazole dose was reduced to 20 mg as previously suggested by Dr. Havery Moros.   Interval History:  A month ago developed chest discomfort / heartburn. Thinks drinking wine, eating red sauce triggered the flare. Mom died of esophageal cancer and he is nervous. He increased pantoprazole back to 40 mg when symptoms started, also stopped the wine and red sauce and symptoms resolved. He drinks a lot of coffee. He has no dysphagia and weight is stable. Patient gives a  history of Barrett's esophagus. He has a history of an irregular Z line  / gastric inlet patch but biopsies negative for Barrett's.    No bowel changes or blood in stool. No [place] of colon cancer    Past Medical History:  Diagnosis Date   Barrett's esophagus    Cataract    bil cateracts removed 2008   GERD (gastroesophageal reflux disease)    Hyperlipidemia    Palpitations 03/29/2019   Sleep apnea    pt does not wear a c-pap   Vertigo     Past Surgical History:  Procedure Laterality Date   CATARACT EXTRACTION W/ INTRAOCULAR LENS  IMPLANT, BILATERAL  2007   COLONOSCOPY     KNEE ARTHROSCOPY  1997   right   TONSILLECTOMY  1968   UPPER GASTROINTESTINAL ENDOSCOPY     WISDOM TOOTH EXTRACTION      Current Medications, Allergies, Family History and Social History were reviewed in [employer] record.     Current Outpatient Medications  Medication Sig Dispense Refill   aspirin EC 81 MG tablet Take 81 mg by mouth daily.     atorvastatin (LIPITOR) 10 MG tablet Take 10 mg by mouth daily.     levothyroxine (SYNTHROID) 75 MCG tablet Take 75 mcg by mouth daily.     pantoprazole (PROTONIX) 40 MG tablet Take 1 tablet (40 mg total) by mouth daily. (Patient taking differently: Take 40 mg by mouth  2 (two) times daily.) 90 tablet 3   Polyethyl Glycol-Propyl Glycol (SYSTANE FREE OP) Apply 1 drop to eye as directed.     No current facility-administered medications for this visit.    Review of Systems: No chest pain. No shortness of breath. No urinary complaints.    Physical Exam  Wt Readings from Last 3 Encounters:  03/04/22 197 lb 6.4 oz (89.5 kg)  12/09/20 204 lb (92.5 kg)  10/27/20 203 lb 3.2 oz (92.2 kg)    BP 118/66   Pulse 61   Ht '5\' 11"'$  (1.803 m)   Wt 197 lb 6.4 oz (89.5 kg)   SpO2 97%   BMI 27.53 kg/m  Constitutional:  Generally well appearing male in no acute distress. Psychiatric: Pleasant. Normal mood and affect. Behavior is normal. EENT: Pupils  normal.  Conjunctivae are normal. No scleral icterus. Neck supple.  Cardiovascular: Normal rate, regular rhythm. No edema Pulmonary/chest: Effort normal and breath sounds normal. No wheezing, rales or rhonchi. Abdominal: Soft, nondistended, nontender. Bowel sounds active throughout. There are no masses palpable. No hepatomegaly. Neurological: Alert and oriented to person place and time. Skin: Skin is warm and dry. No rashes noted.  I spent 30 minutes total reviewing records, obtaining history, performing exam, counseling patient and documenting visit / findings.    Tye Savoy, NP  03/04/2022, 11:00 AM

## 2022-03-07 ENCOUNTER — Telehealth: Payer: Self-pay

## 2022-03-07 NOTE — Telephone Encounter (Signed)
Spoke with pt and gave pt message. Pt verbalized understanding. Has the recall for a colonoscopy this September already been removed or do I remove the one for September 2028?

## 2022-03-07 NOTE — Telephone Encounter (Signed)
-----   Message from Willia Craze, NP sent at 03/04/2022  1:09 PM EDT ----- Kenneth Ingram,  Please call patient.  Today I noticed that he was on for a recall colonoscopy in Sept 2023 at time of surveillance EGD. I thought this was an error as his next exam shouldn't be for 10 years at that time he will have aged out.  I explained this to him today but told him I would clarify with Dr. Havery Moros.    Please let him know that I did speak with Dr. Havery Moros who agrees that he is not due to colonoscopy this Sept. Please remove the colonoscopy recall from system.  He is only for recall EGD at that time , thanks

## 2022-03-08 NOTE — Telephone Encounter (Signed)
Sept 2028 colonoscopy Recall removed.

## 2022-03-08 NOTE — Telephone Encounter (Signed)
Willia Craze, NP     Please remove the colonoscopy for 2023. There shouldn't be another recall in there for him. If there is then it can be removed too. Thanks

## 2022-03-31 DIAGNOSIS — E7801 Familial hypercholesterolemia: Secondary | ICD-10-CM | POA: Diagnosis not present

## 2022-03-31 DIAGNOSIS — R7303 Prediabetes: Secondary | ICD-10-CM | POA: Diagnosis not present

## 2022-03-31 DIAGNOSIS — Z125 Encounter for screening for malignant neoplasm of prostate: Secondary | ICD-10-CM | POA: Diagnosis not present

## 2022-03-31 DIAGNOSIS — E039 Hypothyroidism, unspecified: Secondary | ICD-10-CM | POA: Diagnosis not present

## 2022-04-05 DIAGNOSIS — J309 Allergic rhinitis, unspecified: Secondary | ICD-10-CM | POA: Diagnosis not present

## 2022-04-05 DIAGNOSIS — Z0000 Encounter for general adult medical examination without abnormal findings: Secondary | ICD-10-CM | POA: Diagnosis not present

## 2022-04-05 DIAGNOSIS — Z1211 Encounter for screening for malignant neoplasm of colon: Secondary | ICD-10-CM | POA: Diagnosis not present

## 2022-04-05 DIAGNOSIS — D72819 Decreased white blood cell count, unspecified: Secondary | ICD-10-CM | POA: Diagnosis not present

## 2022-04-05 DIAGNOSIS — K219 Gastro-esophageal reflux disease without esophagitis: Secondary | ICD-10-CM | POA: Diagnosis not present

## 2022-04-05 DIAGNOSIS — R972 Elevated prostate specific antigen [PSA]: Secondary | ICD-10-CM | POA: Diagnosis not present

## 2022-04-05 DIAGNOSIS — I2510 Atherosclerotic heart disease of native coronary artery without angina pectoris: Secondary | ICD-10-CM | POA: Diagnosis not present

## 2022-04-05 DIAGNOSIS — D692 Other nonthrombocytopenic purpura: Secondary | ICD-10-CM | POA: Diagnosis not present

## 2022-04-05 DIAGNOSIS — K2270 Barrett's esophagus without dysplasia: Secondary | ICD-10-CM | POA: Diagnosis not present

## 2022-04-05 DIAGNOSIS — E039 Hypothyroidism, unspecified: Secondary | ICD-10-CM | POA: Diagnosis not present

## 2022-05-17 DIAGNOSIS — L812 Freckles: Secondary | ICD-10-CM | POA: Diagnosis not present

## 2022-05-17 DIAGNOSIS — L218 Other seborrheic dermatitis: Secondary | ICD-10-CM | POA: Diagnosis not present

## 2022-05-17 DIAGNOSIS — L821 Other seborrheic keratosis: Secondary | ICD-10-CM | POA: Diagnosis not present

## 2022-06-13 ENCOUNTER — Encounter: Payer: Self-pay | Admitting: Gastroenterology

## 2022-06-13 ENCOUNTER — Telehealth: Payer: Self-pay | Admitting: Gastroenterology

## 2022-06-13 NOTE — Telephone Encounter (Signed)
Patient called, states he would like to schedule EGD appointment. Please call to schedule.

## 2022-06-13 NOTE — Telephone Encounter (Signed)
Patient is scheduled for 11/2 and Pv on 10/4. Thankx

## 2022-06-13 NOTE — Telephone Encounter (Signed)
Noted, thanks!

## 2022-06-13 NOTE — Telephone Encounter (Signed)
Per our conversation, recall EGD due in September 2023. Please contact to schedule PV and EGD. Thanks

## 2022-06-22 ENCOUNTER — Ambulatory Visit (AMBULATORY_SURGERY_CENTER): Payer: Self-pay

## 2022-06-22 VITALS — Ht 71.0 in | Wt 198.0 lb

## 2022-06-22 DIAGNOSIS — K219 Gastro-esophageal reflux disease without esophagitis: Secondary | ICD-10-CM

## 2022-06-22 NOTE — Progress Notes (Signed)
No egg or soy allergy known to patient  No issues known to pt with past sedation with any surgeries or procedures Patient denies ever being told they had issues or difficulty with intubation  No FH of Malignant Hyperthermia Pt is not on diet pills Pt is not on  home 02  Pt is not on blood thinners  Pt denies issues with constipation  No A fib or A flutter Have any cardiac testing pending--denied Pt instructed to use Single[employer].[email or web address] or GoodRx for a price reduction on prep   

## 2022-07-13 ENCOUNTER — Encounter: Payer: Self-pay | Admitting: Gastroenterology

## 2022-07-21 ENCOUNTER — Ambulatory Visit (AMBULATORY_SURGERY_CENTER): Payer: PPO | Admitting: Gastroenterology

## 2022-07-21 ENCOUNTER — Encounter: Payer: Self-pay | Admitting: Gastroenterology

## 2022-07-21 VITALS — BP 109/73 | HR 60 | Temp 97.1°F | Resp 18 | Ht 71.0 in | Wt 198.0 lb

## 2022-07-21 DIAGNOSIS — K219 Gastro-esophageal reflux disease without esophagitis: Secondary | ICD-10-CM | POA: Diagnosis not present

## 2022-07-21 DIAGNOSIS — Z800 Family history of malignant neoplasm of digestive organs: Secondary | ICD-10-CM | POA: Diagnosis not present

## 2022-07-21 DIAGNOSIS — K2289 Other specified disease of esophagus: Secondary | ICD-10-CM

## 2022-07-21 DIAGNOSIS — G4733 Obstructive sleep apnea (adult) (pediatric): Secondary | ICD-10-CM | POA: Diagnosis not present

## 2022-07-21 DIAGNOSIS — K317 Polyp of stomach and duodenum: Secondary | ICD-10-CM

## 2022-07-21 DIAGNOSIS — K209 Esophagitis, unspecified without bleeding: Secondary | ICD-10-CM

## 2022-07-21 DIAGNOSIS — E039 Hypothyroidism, unspecified: Secondary | ICD-10-CM | POA: Diagnosis not present

## 2022-07-21 DIAGNOSIS — K449 Diaphragmatic hernia without obstruction or gangrene: Secondary | ICD-10-CM

## 2022-07-21 DIAGNOSIS — K2950 Unspecified chronic gastritis without bleeding: Secondary | ICD-10-CM | POA: Diagnosis not present

## 2022-07-21 DIAGNOSIS — K2270 Barrett's esophagus without dysplasia: Secondary | ICD-10-CM | POA: Diagnosis not present

## 2022-07-21 MED ORDER — SODIUM CHLORIDE 0.9 % IV SOLN: 500.0000 mL | Freq: Once | INTRAVENOUS | Status: DC

## 2022-07-21 NOTE — Progress Notes (Signed)
Pt's states no medical or surgical changes since previsit or office visit. 

## 2022-07-21 NOTE — Op Note (Signed)
[facility] Patient Name: Kenneth Ingram Procedure Date: 07/21/2022 9:53 AM MRN: [id number] Endoscopist: Remo Lipps P. Havery Moros , MD, [id number] Age: 76 Referring MD:  Date of Birth: July 16, 1946 Gender: Male Account #: [id number] Procedure:                Upper GI endoscopy Indications:              Screening for Barrett's esophagus, history of GERD,                            family history of esophageal cancer, history of                            irregular z line. On protonix Medicines:                Monitored Anesthesia Care Procedure:                Pre-Anesthesia Assessment:                           - Prior to the procedure, a History and Physical                            was performed, and patient medications and                            allergies were reviewed. The patient's tolerance of                            previous anesthesia was also reviewed. The risks                            and benefits of the procedure and the sedation                            options and risks were discussed with the patient.                            All questions were answered, and informed consent                            was obtained. Prior Anticoagulants: The patient has                            taken no anticoagulant or antiplatelet agents. ASA                            Grade Assessment: II - A patient with mild systemic                            disease. After reviewing the risks and benefits,                            the patient was deemed in satisfactory condition to  undergo the procedure.                           After obtaining informed consent, the endoscope was                            passed under direct vision. Throughout the                            procedure, the patient's blood pressure, pulse, and                            oxygen saturations were monitored continuously. The                            Endoscope was  introduced through the mouth, and                            advanced to the second part of duodenum. The upper                            GI endoscopy was accomplished without difficulty.                            The patient tolerated the procedure well. Scope In: Scope Out: Findings:                 Esophagogastric landmarks were identified: the                            Z-line was found at 39 cm, the gastroesophageal                            junction was found at 39 cm and the upper extent of                            the gastric folds was found at 41 cm from the                            incisors.                           A 2 cm hiatal hernia was present.                           The Z-line was irregular and was found 39 cm from                            the incisors, a roughly 8-100m tongue of salmon                            colored mucosa without nodularity. Biopsies were  taken with a cold forceps for histology.                           Areas of ectopic gastric mucosa were found in the                            upper third of the esophagus (similar in appearance                            to the last exam - large areas, previously biopsied                            - no evidence of Barrett's).                           The exam of the esophagus was otherwise normal.                           A few small sessile polyps were found in the                            gastric fundus and in the gastric body, suspect                            benign fundic gland polyps. One representative                            polyp was removed with a cold biopsy forceps to                            rule out adenoma. Resection and retrieval were                            complete.                           The exam of the stomach was otherwise normal.                           The examined duodenum was normal. Complications:            No immediate  complications. Estimated blood loss:                            Minimal. Estimated Blood Loss:     Estimated blood loss was minimal. Impression:               - Esophagogastric landmarks identified.                           - 2 cm hiatal hernia.                           - Z-line irregular, 39 cm from the incisors.  Biopsied.                           - Ectopic gastric mucosa in the upper third of the                            esophagus.                           - Normal esophagus otherwise.                           - A few gastric polyps. Likely benign fundic gland                            polyps. Representative sample resected and                            retrieved.                           - Normal examined duodenum. Recommendation:           - Patient has a contact number available for                            emergencies. The signs and symptoms of potential                            delayed complications were discussed with the                            patient. Return to normal activities tomorrow.                            Written discharge instructions were provided to the                            patient.                           - Resume previous diet.                           - Continue present medications.                           - Await pathology results. Remo Lipps P. Havery Moros, MD 07/21/2022 10:21:44 AM This report has been signed electronically.

## 2022-07-21 NOTE — Progress Notes (Signed)
Pt resting comfortably. VSS. Airway intact. SBAR complete to RN. All questions answered.   

## 2022-07-21 NOTE — Progress Notes (Signed)
[facility] History and Physical   Primary Care Physician:  Kenneth Pretty, MD   Reason for Procedure:   GERD, irregular z line, family history of esophageal cancer  Plan:    EGD     HPI: Kenneth Ingram. is a 76 y.o. male  here for EGD surveillance - history of irregular zline with biopsies negative for BE 5 years ago, mother had esophageal cancer. He wishes to have surveillance EGD, history of reflux in light of these other issues. On protonix '40mg'$   /day  Otherwise feels well without any cardiopulmonary symptoms.   I have discussed risks / benefits of anesthesia and endoscopic procedure with Kenneth Ingram. and they wish to proceed with the exams as outlined today.    Past Medical History:  Diagnosis Date   Barrett's esophagus    Cataract    bil cateracts removed 2008   GERD (gastroesophageal reflux disease)    Hyperlipidemia    Palpitations 03/29/2019   Sleep apnea    pt does not wear a c-pap   Vertigo     Past Surgical History:  Procedure Laterality Date   CATARACT EXTRACTION W/ INTRAOCULAR LENS  IMPLANT, BILATERAL  2007   COLONOSCOPY     KNEE ARTHROSCOPY  1997   right   TONSILLECTOMY  1968   UPPER GASTROINTESTINAL ENDOSCOPY     WISDOM TOOTH EXTRACTION      Prior to Admission medications   Medication Sig Start Date End Date Taking? Authorizing Provider  aspirin EC 81 MG tablet Take 81 mg by mouth daily.   Yes [provider]  atorvastatin (LIPITOR) 10 MG tablet Take 10 mg by mouth daily. 04/28/16  Yes [provider]  levothyroxine (SYNTHROID) 75 MCG tablet Take 75 mcg by mouth daily. 01/12/22  Yes [provider]  pantoprazole (PROTONIX) 40 MG tablet Take 1 tablet (40 mg total) by mouth daily. Patient taking differently: Take 40 mg by mouth 2 (two) times daily. 05/06/21  Yes Kenneth Craze, NP  Polyethyl Glycol-Propyl Glycol (SYSTANE FREE OP) Apply 1 drop to eye as directed.   Yes [provider]     Current Outpatient Medications  Medication Sig Dispense Refill   aspirin EC 81 MG tablet Take 81 mg by mouth daily.     atorvastatin (LIPITOR) 10 MG tablet Take 10 mg by mouth daily.     levothyroxine (SYNTHROID) 75 MCG tablet Take 75 mcg by mouth daily.     pantoprazole (PROTONIX) 40 MG tablet Take 1 tablet (40 mg total) by mouth daily. (Patient taking differently: Take 40 mg by mouth 2 (two) times daily.) 90 tablet 3   Polyethyl Glycol-Propyl Glycol (SYSTANE FREE OP) Apply 1 drop to eye as directed.     Current Facility-Administered Medications  Medication Dose Route Frequency Provider Last Rate Last Admin   0.9 %  sodium chloride infusion  500 mL Intravenous Once Kenneth Ingram, Carlota Raspberry, MD        Allergies as of 07/21/2022   (No Known Allergies)    Family History  Problem Relation Age of Onset   Esophageal cancer Mother 44       died soon after dx   Colon cancer Neg Hx    Pancreatic cancer Neg Hx    Prostate cancer Neg Hx    Rectal cancer Neg Hx    Stomach cancer Neg Hx     Social History   Socioeconomic History   Marital status: Married    Spouse name:  Not on file   Number of children: 2   Years of education: Not on file   Highest education level: Not on file  Occupational History   Not on file  Tobacco Use   Smoking status: Former    Packs/day: 1.00    Years: 20.00    Total pack years: 20.00    Types: Cigarettes    Quit date: 46    Years since quitting: 27.8   Smokeless tobacco: Never  Vaping Use   Vaping Use: Never used  Substance and Sexual Activity   Alcohol use: Yes    Alcohol/week: 4.0 standard drinks of alcohol    Types: 4 Glasses of wine per week    Comment: DAILY   Drug use: No   Sexual activity: Not on file  Other Topics Concern   Not on file  Social History Narrative   Not on file   Social Determinants of Health   Financial Resource Strain: Not on file  Food Insecurity: Not on file  Transportation Needs: Not on file  Physical  Activity: Not on file  Stress: Not on file  Social Connections: Not on file  Intimate Partner Violence: Not on file    Review of Systems: All other review of systems negative except as mentioned in the HPI.  Physical Exam: Vital signs BP (!) 143/73   Pulse (!) 59   Temp (!) 97.1 F (36.2 C)   Ht '5\' 11"'$  (1.803 m)   Wt 198 lb (89.8 kg)   SpO2 98%   BMI 27.62 kg/m   General:   Alert,  Well-developed, pleasant and cooperative in NAD Lungs:  Clear throughout to auscultation.   Heart:  Regular rate and rhythm Abdomen:  Soft, nontender and nondistended.   Neuro/Psych:  Alert and cooperative. Normal mood and affect. A and O x 3  Jolly Mango, MD [facility]

## 2022-07-21 NOTE — Patient Instructions (Addendum)
-   Patient has a contact number available for emergencies. The signs and symptoms of potential delayed complications were discussed with the patient. Return to normal activities tomorrow. Written discharge instructions were provided to the patient. - Resume previous diet. - Continue present medications. - Await pathology results.  YOU HAD AN ENDOSCOPIC PROCEDURE TODAY AT [facility]:   Refer to the procedure report that was given to you for any specific questions about what was found during the examination.  If the procedure report does not answer your questions, please call your gastroenterologist to clarify.  If you requested that your care partner not be given the details of your procedure findings, then the procedure report has been included in a sealed envelope for you to review at your convenience later.  YOU SHOULD EXPECT: Some feelings of bloating in the abdomen. Passage of more gas than usual.  Walking can help get rid of the air that was put into your GI tract during the procedure and reduce the bloating. If you had a lower endoscopy (such as a colonoscopy or flexible sigmoidoscopy) you may notice spotting of blood in your stool or on the toilet paper. If you underwent a bowel prep for your procedure, you may not have a normal bowel movement for a few days.  Please Note:  You might notice some irritation and congestion in your nose or some drainage.  This is from the oxygen used during your procedure.  There is no need for concern and it should clear up in a day or so.  SYMPTOMS TO REPORT IMMEDIATELY: Following upper endoscopy (EGD)  Vomiting of blood or coffee ground material  New chest pain or pain under the shoulder blades  Painful or persistently difficult swallowing  New shortness of breath  Fever of 100F or higher  Black, tarry-looking stools  For urgent or emergent issues, a gastroenterologist can be reached at any hour by calling [phone]. Do not use  MyChart messaging for urgent concerns.    DIET:  We do recommend a small meal at first, but then you may proceed to your regular diet.  Drink plenty of fluids but you should avoid alcoholic beverages for 24 hours.  ACTIVITY:  You should plan to take it easy for the rest of today and you should NOT DRIVE or use heavy machinery until tomorrow (because of the sedation medicines used during the test).    FOLLOW UP: Our staff will call the number listed on your records the next business day following your procedure.  We will call around 7:15- 8:00 am to check on you and address any questions or concerns that you may have regarding the information given to you following your procedure. If we do not reach you, we will leave a message.     If any biopsies were taken you will be contacted by phone or by letter within the next 1-3 weeks.  Please call us at [phone] if you have not heard about the biopsies in 3 weeks.    SIGNATURES/CONFIDENTIALITY: You and/or your care partner have signed paperwork which will be entered into your electronic medical record.  These signatures attest to the fact that that the information above on your After Visit Summary has been reviewed and is understood.  Full responsibility of the confidentiality of this discharge information lies with you and/or your care-partner.

## 2022-07-22 ENCOUNTER — Telehealth: Payer: Self-pay | Admitting: *Deleted

## 2022-07-22 NOTE — Telephone Encounter (Signed)
  Follow up Call-     07/21/2022    8:56 AM  Call back number  Post procedure Call Back phone  # [phone]  Permission to leave phone message Yes     Patient questions:  Do you have a fever, pain , or abdominal swelling? No. Pain Score  0 *  Have you tolerated food without any problems? Yes.    Have you been able to return to your normal activities? Yes.    Do you have any questions about your discharge instructions: Diet   No. Medications  No. Follow up visit  No.  Do you have questions or concerns about your Care? No.  Actions: * If pain score is 4 or above: No action needed, pain <4.

## 2022-07-25 ENCOUNTER — Encounter: Payer: Self-pay | Admitting: Gastroenterology

## 2022-08-03 ENCOUNTER — Ambulatory Visit: Payer: Self-pay

## 2022-08-03 ENCOUNTER — Ambulatory Visit: Payer: PPO | Admitting: Family Medicine

## 2022-08-03 VITALS — BP 148/84 | HR 63 | Ht 71.0 in | Wt 201.0 lb

## 2022-08-03 DIAGNOSIS — M25532 Pain in left wrist: Secondary | ICD-10-CM

## 2022-08-03 DIAGNOSIS — G5602 Carpal tunnel syndrome, left upper limb: Secondary | ICD-10-CM | POA: Diagnosis not present

## 2022-08-03 MED ORDER — GABAPENTIN 100 MG PO CAPS: 100.0000 mg | ORAL_CAPSULE | Freq: Every evening | ORAL | 1 refills | Status: AC | PRN

## 2022-08-03 NOTE — Patient Instructions (Signed)
Thank you for coming in today.   Call or go to the ER if you develop a large red swollen joint with extreme pain or oozing puss.    Carpal tunnel wrist brace or thumb loop.   Gabapentin as needed at bedtime.   You should hear form neurology about nerve test.   Let me know if this is not happening.

## 2022-08-03 NOTE — Progress Notes (Unsigned)
   I, Peterson Lombard, LAT, ATC acting as a scribe for Lynne Leader, MD.  Kenneth Ingram. is a 76 y.o. male who presents to [facility] today for left hand pain.  Patient completed a prior course of PT at [facility]. Patient was previously seen by Dr. Georgina Snell on 10/27/2020 for chronic right knee and right wrist pain.  Today, patient reports L hand pain ongoing for about 10 days-2 weeks. Pt c/o pain waking him up at night. Pt locates pain to  across the L wrist w/ shooting pain into the hand and into forearm. Pt does a lot of shotgun shooting.  Pt is R-hand dominate.   L wrist swelling: no Grip strength: no Paresthesias: Yes- 1st-4th fingers Aggravates: nothing in particular Treatments tried: IBU, heat, icy hot, wrist brace  Dx imaging: 07/30/20 R wrist XR  06/25/19 R wrist XR  Pertinent review of systems: ***  Relevant historical information: ***   Exam:  There were no vitals taken for this visit. General: Well Developed, well nourished, and in no acute distress.   MSK: ***    Lab and Radiology Results No results found for this or any previous visit (from the past 72 hour(s)). No results found.     Assessment and Plan: 75 y.o. male with ***   PDMP not reviewed this encounter. No orders of the defined types were placed in this encounter.  No orders of the defined types were placed in this encounter.    Discussed warning signs or symptoms. Please see discharge instructions. Patient expresses understanding.   ***

## 2022-08-31 DIAGNOSIS — H5213 Myopia, bilateral: Secondary | ICD-10-CM | POA: Diagnosis not present

## 2022-08-31 DIAGNOSIS — Z961 Presence of intraocular lens: Secondary | ICD-10-CM | POA: Diagnosis not present

## 2022-08-31 DIAGNOSIS — D3132 Benign neoplasm of left choroid: Secondary | ICD-10-CM | POA: Diagnosis not present

## 2022-11-08 DIAGNOSIS — R972 Elevated prostate specific antigen [PSA]: Secondary | ICD-10-CM | POA: Diagnosis not present

## 2022-12-12 DIAGNOSIS — Z125 Encounter for screening for malignant neoplasm of prostate: Secondary | ICD-10-CM | POA: Diagnosis not present

## 2023-02-27 DIAGNOSIS — M545 Low back pain, unspecified: Secondary | ICD-10-CM | POA: Diagnosis not present

## 2023-02-27 DIAGNOSIS — R262 Difficulty in walking, not elsewhere classified: Secondary | ICD-10-CM | POA: Diagnosis not present

## 2023-03-06 ENCOUNTER — Ambulatory Visit (INDEPENDENT_AMBULATORY_CARE_PROVIDER_SITE_OTHER): Payer: PPO

## 2023-03-06 ENCOUNTER — Ambulatory Visit (INDEPENDENT_AMBULATORY_CARE_PROVIDER_SITE_OTHER): Payer: PPO | Admitting: Family Medicine

## 2023-03-06 ENCOUNTER — Other Ambulatory Visit: Payer: Self-pay

## 2023-03-06 VITALS — BP 124/60 | HR 75 | Ht 71.0 in | Wt 197.0 lb

## 2023-03-06 DIAGNOSIS — M545 Low back pain, unspecified: Secondary | ICD-10-CM | POA: Diagnosis not present

## 2023-03-06 DIAGNOSIS — M25531 Pain in right wrist: Secondary | ICD-10-CM

## 2023-03-06 DIAGNOSIS — M19031 Primary osteoarthritis, right wrist: Secondary | ICD-10-CM | POA: Diagnosis not present

## 2023-03-06 DIAGNOSIS — R262 Difficulty in walking, not elsewhere classified: Secondary | ICD-10-CM | POA: Diagnosis not present

## 2023-03-06 NOTE — Progress Notes (Unsigned)
   Rubin Payor, PhD, LAT, ATC acting as a scribe for [name], MD.  Kenneth Ingram. is a 77 y.o. male who presents to [facility] today for cont'd R wrist pain. Pt's last visit w/ Dr. Denyse Amass, on 08/03/22 focused on her L wrist and he was given a L carpal tunnel steroid injection. He was last seen for his R wrist in 2021-22, but he decline to f/u w/ either a steroid injection or more advanced imaging.  Today, pt reports Right wrist pain going for several years.  He has had pain at the ulnar wrist on and off for years worse over the last 3 months.  He had a overly aggressive handshake with a gentleman which seem to precipitate this in 2018.. States that even gripping something the wrong way will be shooting pain.   He had a limited trial of physical therapy in the past for this that did not help.  He has not had dedicated hand therapy yet.   Dx imaging: 07/30/20 R wrist XR             06/25/19 R wrist XR  Pertinent review of systems: No fevers or chills  Relevant historical information: Hyperlipidemia   Exam:  BP 124/60   Pulse 75   Ht 5\' 11"  (1.803 m)   Wt 197 lb (89.4 kg)   SpO2 93%   BMI 27.48 kg/m  General: Well Developed, well nourished, and in no acute distress.   MSK: Right wrist normal appearing. Tender palpation at TFCC area.  Normal wrist motion.  Pain with ulnar deviation.  Grip strength is intact.    Lab and Radiology Results  X-ray images right wrist obtained today personally and independently interpreted. Degenerative changes and sclerosis visible at the lunate capitate articulation.  No acute fractures are visible. Await formal radiology review     Assessment and Plan: 77 y.o. male with chronic right wrist pain thought to be a TFCC tear or perhaps ECU tenosynovitis.  Plan for trial of occupational therapy.  If not improved after 1 or 2 months recommend MRI arthrogram.   PDMP not reviewed this encounter. Orders  Placed This Encounter  Procedures   DG Wrist Complete Right    Standing Status:   Future    Number of Occurrences:   1    Standing Expiration Date:   03/05/2024    Order Specific Question:   Reason for Exam (SYMPTOM  OR DIAGNOSIS REQUIRED)    Answer:   eval wrist pain r    Order Specific Question:   Preferred imaging location?    Answer:   [facility]   Ambulatory referral to Occupational Therapy    Referral Priority:   Routine    Referral Type:   Occupational Therapy    Referral Reason:   Specialty Services Required    Requested Specialty:   Occupational Therapy    Number of Visits Requested:   1   No orders of the defined types were placed in this encounter.    Discussed warning signs or symptoms. Please see discharge instructions. Patient expresses understanding.   The above documentation has been reviewed and is accurate and complete [name], M.D.

## 2023-03-06 NOTE — Patient Instructions (Addendum)
Thank you for coming in today.   Please get an Xray today before you leave.  I've referred you to Occupational Therapy.  Let us know if you don't hear from them in one week.   If not good enough in a month or two let me know and next step should be MRI arthrogram. I can get it set up so a cortisone injection can be done at the same.

## 2023-03-10 NOTE — Therapy (Signed)
OUTPATIENT OCCUPATIONAL THERAPY ORTHO EVALUATION  Patient Name: Kenneth Ingram. MRN: [id number] DOB:18-Jul-1946, 77 y.o., male 52 Date: 03/13/2023  PCP: Merri Brunette, MD REFERRING PROVIDER: Rodolph Bong, MD   END OF SESSION:  OT End of Session - 03/13/23 1300     Visit Number 1    Number of Visits 8    Date for OT Re-Evaluation 04/28/23    Authorization Type [employer]    OT Start Time 1301    OT Stop Time 1354    OT Time Calculation (min) 53 min    Equipment Utilized During Treatment orthotic materials    Activity Tolerance Patient tolerated treatment well;Patient limited by pain;Patient limited by fatigue    Behavior During Therapy [facility] for tasks assessed/performed             Past Medical History:  Diagnosis Date   Barrett's esophagus    Cataract    bil cateracts removed 2008   GERD (gastroesophageal reflux disease)    Hyperlipidemia    Palpitations 03/29/2019   Sleep apnea    pt does not wear a c-pap   Vertigo    Past Surgical History:  Procedure Laterality Date   CATARACT EXTRACTION W/ INTRAOCULAR LENS  IMPLANT, BILATERAL  2007   COLONOSCOPY     KNEE ARTHROSCOPY  1997   right   TONSILLECTOMY  1968   UPPER GASTROINTESTINAL ENDOSCOPY     WISDOM TOOTH EXTRACTION     Patient Active Problem List   Diagnosis Date Noted   Hypothyroidism 06/30/2020   Palpitations 03/29/2019   Coronary artery calcification seen on CT scan 06/22/2018   Hyperlipidemia 06/22/2018    ONSET DATE: Chronic   REFERRING DIAG: M25.531 (ICD-10-CM) - Right wrist pain   THERAPY DIAG:  Pain in right wrist  Muscle weakness (generalized)  Other lack of coordination  Stiffness of right wrist, not elsewhere classified  Rationale for Evaluation and Treatment: Rehabilitation  SUBJECTIVE:   SUBJECTIVE STATEMENT: He states falling in [place] in 2018 and also re-injuring wrist in 2019. He had negative x-rays, but it's hurt ever since. He does have bracing for night  and workouts, he still does trap shooting. He states working out with braces isn't too agravating and it takes >   PERTINENT HISTORY: Per MD notes: "cont'd R wrist pain. Pt's last visit w/ Dr. Denyse Amass, on 08/03/22 focused on her L wrist and he was given a L carpal tunnel steroid injection. He was last seen for his R wrist in 2021-22, but he decline to f/u w/ either a steroid injection or more advanced imaging. Today, pt reports Right wrist pain going for several years.  He has had pain at the ulnar wrist on and off for years worse over the last 3 months.  He had a overly aggressive handshake with a gentleman which seem to precipitate this in 2018.. States that even gripping something the wrong way will be shooting pain."   "77 y.o. male with chronic right wrist pain thought to be a TFCC tear or perhaps ECU tenosynovitis.  Plan for trial of occupational therapy.  If not improved after 1 or 2 months recommend MRI arthrogram. "  PRECAUTIONS: None  WEIGHT BEARING RESTRICTIONS: No  PAIN:  Are you having pain? Yes: NPRS scale: 0/10 at rest, at worst goes up to  8/10 Pain location: dorsal Rt wrist near ECU insertion  Pain description: sharp Aggravating factors: ulnar deviaition or wrist ext Relieving factors: rest  FALLS: Has patient fallen in  last 6 months? No  LIVING ENVIRONMENT: Lives with: lives with their spouse Lives in: House/apartment Has following equipment at home: None other than some pre-fab braces that he's been trying   PLOF: Independent  PATIENT GOALS: To improve the use of his right wrist and arm with selective activities like weight lifting, shooting his rifle, and some other IADLs.   OBJECTIVE: (All objective assessments below are from initial evaluation on: 03/13/23 unless otherwise specified.)   HAND DOMINANCE: Right   ADLs: Overall ADLs: States little to no issues with basic activities of daily living, but some motions hurt him with forearm supination wrist extension or  ulnar deviation, especially with weightbearing tasks like self-care health maintenance, leisure activity rifle shooting, lifting and moving objects at times.   FUNCTIONAL OUTCOME MEASURES: Eval: Patient Rated Wrist Evaluation (PRWE): Pain: 13/50; Function: 3/50; Total Score: 16/100 (Higher Score  =  More Pain and/or Debility)    UPPER EXTREMITY ROM     Shoulder to Wrist AROM Right eval Left eval  Forearm supination 62 83  Forearm pronation  82 81  Wrist flexion 54 pain 70  Wrist extension 56 tender 73  Wrist ulnar deviation 21 25  Wrist radial deviation 15 20  Functional dart thrower's motion (F-DTM) in ulnar flexion 50 67  F-DTM in radial extension  62 68  (Blank rows = not tested)   Hand AROM Right eval  Full Fist Ability (or Gap to Distal Palmar Crease) full  Thumb Opposition  (Kapandji Scale)  full  (Blank rows = not tested)   UPPER EXTREMITY MMT:     MMT Right 03/13/23  Forearm supination 4/5 tender  Forearm pronation 5/5  Wrist flexion 5/5  Wrist extension 4/5 tender  Wrist ulnar deviation 4/5 tender  Wrist radial deviation 5/5  (Blank rows = not tested)  HAND FUNCTION: Eval: No observed weakness in affected Rt hand, but details will be inspected possibly in next 1-2 visits  Grip strength Right: TBD lbs, Left: TBD lbs   COORDINATION: Eval: Other than poor movement patterns at times, no observed coordination impairments with affected Rt hand.  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  Interestingly, provocative ulnar impaction is not painful today but possibly due to buildup of scar tissue and the chronicity of injury.  ECU was not found to be tender or painful along the muscle belly  or insertion.    TODAY'S TREATMENT:  Post-evaluation treatment:  Today OT custom fabricates soft wrist orthosis for circumferential support and avoidance of pressure along the ulnar styloid process.  This is specifically designed for TFCC injuries and help stabilize the DRUJ.  He performs a functional activity of simulated rifle shooting without the wrist strap and states he has pain and with this wrist strap he states there is no pain but only some pressure, additionally he can shake hands with this alone and move his wrist forward and backwards better with less pain.  He is educated to wear this as often as needed-can wear lightly all day but should loosen it when he is not doing any heavy lifting and tight and it when he is doing something more aggressive.  He states understanding   PATIENT EDUCATION: Education details:  See tx section above for details  Person educated: Patient Education method: Verbal Instruction, Teach back, Handouts  Education comprehension: States and demonstrates understanding, Additional Education required    HOME EXERCISE PROGRAM: See tx section above for details    GOALS: Goals reviewed with patient? Yes   SHORT TERM GOALS: (STG required if POC>30 days) Target Date: 04/07/23  Pt will obtain protective, custom orthotic. Goal status:  MET   2.  Pt will demo/state understanding of initial HEP to improve pain levels and prerequisite motion. Goal status: INITIAL   LONG TERM GOALS: Target Date: 04/28/23  Pt will improve functional ability by decreased impairment per PRWE assessment from 16/100 to 10/100 or better, for better quality of life. Goal status: INITIAL  2.  Pt will improve A/ROM in Rt wrist flex/ext from mid 50*'s to at least mid 60*'s, to have functional motion for tasks like reach and grasp.  Goal status: INITIAL  3.  Pt will improve strength in Rt wrist/FA ext and supination from 4/5 MMT tenderly to at least 4+/5 MMT to have increased  functional ability to carry out selfcare and higher-level homecare tasks with no difficulty. Goal status: INITIAL  4. Pt will decrease pain at worst from 8/10 to 4/10 or better to have better sleep and occupational participation in daily roles. Goal status: INITIAL   ASSESSMENT:  CLINICAL IMPRESSION: Patient is a 77 y.o. male who was seen today for occupational therapy evaluation for chronic Rt wrist pain that does appear to be resulting from instability and chronic TFCC tear (does not appear to be ECU tendinitis).  He has difficulty shaking hands and performing desired leisure activities. He will benefit from OP OT to decrease pain and increase quality of life.    PERFORMANCE DEFICITS: in functional skills including IADLs, ROM, strength, pain, fascial restrictions, Gross motor control, body mechanics, endurance, decreased knowledge of precautions, and UE functional use, cognitive skills including problem solving and safety awareness, and psychosocial skills including coping strategies, environmental adaptation, and habits.   IMPAIRMENTS: are limiting patient from IADLs, leisure, and social participation.   COMORBIDITIES: may have co-morbidities  that affects occupational performance. Patient will benefit from skilled OT to address above impairments and improve overall function.  MODIFICATION OR ASSISTANCE TO COMPLETE EVALUATION: No modification of tasks or assist necessary to complete an evaluation.  OT OCCUPATIONAL PROFILE AND HISTORY: Detailed assessment: Review of records and additional review of physical, cognitive, psychosocial history related to current functional performance.  CLINICAL DECISION MAKING: LOW - limited treatment options, no task modification necessary  REHAB POTENTIAL: Good  EVALUATION COMPLEXITY: Low      PLAN:  OT FREQUENCY: 1-2x/week  OT DURATION: 6 weeks  PLANNED INTERVENTIONS: self care/ADL training, therapeutic exercise, therapeutic activity,  neuromuscular re-education, manual therapy, splinting, electrical stimulation, ultrasound, fluidotherapy, compression bandaging, moist heat, cryotherapy, contrast bath, patient/family education, energy conservation, coping strategies training, DME and/or AE instructions, and Dry needling  RECOMMENDED OTHER SERVICES: none now   CONSULTED AND AGREED WITH PLAN OF CARE: Patient  PLAN FOR NEXT SESSION:   Check wrist soft orthosis and also give out initial home exercise program to help with tightness/coping, also work on isometric pronation strengthening as this was shown to improve stability of the DRUJ and carpals after TFCC injury   Fannie Knee, OTR/L 03/13/2023, 5:34 PM

## 2023-03-13 ENCOUNTER — Encounter: Payer: Self-pay | Admitting: Rehabilitative and Restorative Service Providers"

## 2023-03-13 ENCOUNTER — Other Ambulatory Visit: Payer: Self-pay

## 2023-03-13 ENCOUNTER — Ambulatory Visit: Payer: PPO | Admitting: Rehabilitative and Restorative Service Providers"

## 2023-03-13 DIAGNOSIS — R262 Difficulty in walking, not elsewhere classified: Secondary | ICD-10-CM | POA: Diagnosis not present

## 2023-03-13 DIAGNOSIS — M25631 Stiffness of right wrist, not elsewhere classified: Secondary | ICD-10-CM | POA: Diagnosis not present

## 2023-03-13 DIAGNOSIS — M25531 Pain in right wrist: Secondary | ICD-10-CM | POA: Diagnosis not present

## 2023-03-13 DIAGNOSIS — M545 Low back pain, unspecified: Secondary | ICD-10-CM | POA: Diagnosis not present

## 2023-03-13 DIAGNOSIS — M6281 Muscle weakness (generalized): Secondary | ICD-10-CM | POA: Diagnosis not present

## 2023-03-13 DIAGNOSIS — R278 Other lack of coordination: Secondary | ICD-10-CM

## 2023-03-13 NOTE — Progress Notes (Signed)
Right wrist x-ray shows some arthritis.  No fractures present.

## 2023-03-24 NOTE — Therapy (Signed)
OUTPATIENT OCCUPATIONAL THERAPY TREATMENT NOTE  Patient Name: Kenneth Ingram. MRN: [id number] DOB:1945-12-22, 77 y.o., male Today's Date: 03/27/2023  PCP: Merri Brunette, MD REFERRING PROVIDER: Rodolph Bong, MD   END OF SESSION:  OT End of Session - 03/27/23 1400     Visit Number 2    Number of Visits 8    Date for OT Re-Evaluation 04/28/23    Authorization Type [employer]    OT Start Time 1400    OT Stop Time 1426    OT Time Calculation (min) 26 min    Activity Tolerance Patient tolerated treatment well;Patient limited by fatigue;No increased pain    Behavior During Therapy [facility] for tasks assessed/performed              Past Medical History:  Diagnosis Date   Barrett's esophagus    Cataract    bil cateracts removed 2008   GERD (gastroesophageal reflux disease)    Hyperlipidemia    Palpitations 03/29/2019   Sleep apnea    pt does not wear a c-pap   Vertigo    Past Surgical History:  Procedure Laterality Date   CATARACT EXTRACTION W/ INTRAOCULAR LENS  IMPLANT, BILATERAL  2007   COLONOSCOPY     KNEE ARTHROSCOPY  1997   right   TONSILLECTOMY  1968   UPPER GASTROINTESTINAL ENDOSCOPY     WISDOM TOOTH EXTRACTION     Patient Active Problem List   Diagnosis Date Noted   Hypothyroidism 06/30/2020   Palpitations 03/29/2019   Coronary artery calcification seen on CT scan 06/22/2018   Hyperlipidemia 06/22/2018    ONSET DATE: Chronic   REFERRING DIAG: M25.531 (ICD-10-CM) - Right wrist pain   THERAPY DIAG:  Pain in right wrist  Muscle weakness (generalized)  Other lack of coordination  Stiffness of right wrist, not elsewhere classified  Rationale for Evaluation and Treatment: Rehabilitation  PERTINENT HISTORY: Per MD notes: "cont'd R wrist pain. Pt's last visit w/ Dr. Denyse Amass, on 08/03/22 focused on her L wrist and he was given a L carpal tunnel steroid injection. He was last seen for his R wrist in 2021-22, but he decline to f/u w/ either a  steroid injection or more advanced imaging. Today, pt reports Right wrist pain going for several years.  He has had pain at the ulnar wrist on and off for years worse over the last 3 months.  He had a overly aggressive handshake with a gentleman which seem to precipitate this in 2018.. States that even gripping something the wrong way will be shooting pain."   "77 y.o. male with chronic right wrist pain thought to be a TFCC tear or perhaps ECU tenosynovitis.  Plan for trial of occupational therapy.  If not improved after 1 or 2 months recommend MRI arthrogram. "  PRECAUTIONS: None; WEIGHT BEARING RESTRICTIONS: No  SUBJECTIVE:   SUBJECTIVE STATEMENT: He states no pain with working out, shaking, hands, shooting, etc. With wrist orthosis on.    PAIN:  Are you having pain?   Yes: NPRS scale: 0/10 at rest, and none in past week significantly  Pain location: dorsal Rt wrist near ECU insertion  Pain description: sharp Aggravating factors: ulnar deviaition or wrist ext Relieving factors: rest   PATIENT GOALS: To improve the use of his right wrist and arm with selective activities like weight lifting, shooting his rifle, and some other IADLs.   OBJECTIVE: (All objective assessments below are from initial evaluation on: 03/13/23 unless otherwise specified.)   HAND  DOMINANCE: Right   ADLs: Overall ADLs: States little to no issues with basic activities of daily living, but some motions hurt him with forearm supination wrist extension or ulnar deviation, especially with weightbearing tasks like self-care health maintenance, leisure activity rifle shooting, lifting and moving objects at times.   FUNCTIONAL OUTCOME MEASURES: Eval: Patient Rated Wrist Evaluation (PRWE): Pain: 13/50; Function: 3/50; Total Score: 16/100 (Higher Score  =  More Pain and/or Debility)    UPPER EXTREMITY ROM     Shoulder to Wrist AROM Right eval Left eval Rt 03/27/23  Forearm supination 62 83   Forearm pronation  82 81    Wrist flexion 54 pain 70 51  Wrist extension 56 tender 73 57  Wrist ulnar deviation 21 25   Wrist radial deviation 15 20   Functional dart thrower's motion (F-DTM) in ulnar flexion 50 67   F-DTM in radial extension  62 68   (Blank rows = not tested)   Hand AROM Right eval  Full Fist Ability (or Gap to Distal Palmar Crease) full  Thumb Opposition  (Kapandji Scale)  full  (Blank rows = not tested)   UPPER EXTREMITY MMT:     MMT Right 03/13/23  Forearm supination 4/5 tender  Forearm pronation 5/5  Wrist flexion 5/5  Wrist extension 4/5 tender  Wrist ulnar deviation 4/5 tender  Wrist radial deviation 5/5  (Blank rows = not tested)  HAND FUNCTION: Eval: No observed weakness in affected Rt hand, but details will be inspected possibly in next 1-2 visits  Grip strength Right: TBD lbs, Left: TBD lbs   COORDINATION: Eval: Other than poor movement patterns at times, no observed coordination impairments with affected Rt hand.  OBSERVATIONS:   Eval: Positive ulnar fovea sign, positive piano key testing with "clunk" in DRUJ, some instability felt Rt  vs Lt wrist from likely chronic TFCC tear.  Interestingly, provocative ulnar impaction is not painful today but possibly due to buildup of scar tissue and the chronicity of injury.  ECU was not found to be tender or painful along the muscle belly or insertion.    TODAY'S TREATMENT:  03/27/23: He starts with active range of motion which shows stiffness and some pain remaining and wrist motions and forearm motions of the right arm.  For this OT educates on nonpainful self stretches to be performed 2-3 times a day non-painfully as listed below.  He is also shown a pronation isometric strengthening technique using a hammer or long handled object to help increase stability of the wrist by strengthening pronator quadratus.  He has no pain with this and is able to do it without his wrist orthosis on.  For continued long-term care, he was recommended  a prefabricated wrist brace that he could purchase and use if his custom fabricated wrist orthosis wears out.  He states understanding and leaves today and no pain and understanding directions.    Exercises - Wrist Prayer Stretch  - 2-3 x daily - 3-5 reps - 15 sec hold - Seated Wrist Flexion Stretch  - 3 x daily - 3 reps - 15 hold - Forearm Supination Stretch  - 3-4 x daily - 3-5 reps - 15 sec hold - Hammer Stretch or Strength   - 2-4 x daily - 1-2 sets - 5 reps - 10 sec hold   PATIENT EDUCATION: Education details: See tx section above for details  Person educated: Patient Education method: Verbal Instruction, Teach back, Handouts  Education comprehension: States and demonstrates understanding,  Additional Education required    HOME EXERCISE PROGRAM: Access Code: [email or web address] URL: [email or web address] Date: 03/27/2023 Prepared by: Fannie Knee   GOALS: Goals reviewed with patient? Yes   SHORT TERM GOALS: (STG required if POC>30 days) Target Date: 04/07/23  Pt will obtain protective, custom orthotic. Goal status:  MET   2.  Pt will demo/state understanding of initial HEP to improve pain levels and prerequisite motion. Goal status: 03/27/23: MET   LONG TERM GOALS: Target Date: 04/28/23  Pt will improve functional ability by decreased impairment per PRWE assessment from 16/100 to 10/100 or better, for better quality of life. Goal status: INITIAL  2.  Pt will improve A/ROM in Rt wrist flex/ext from mid 50*'s to at least mid 60*'s, to have functional motion for tasks like reach and grasp.  Goal status: INITIAL  3.  Pt will improve strength in Rt wrist/FA ext and supination from 4/5 MMT tenderly to at least 4+/5 MMT to have increased functional ability to carry out selfcare and higher-level homecare tasks with no difficulty. Goal status: INITIAL  4. Pt will decrease pain at worst from 8/10 to 4/10 or better to have better sleep and occupational participation in  daily roles. Goal status: INITIAL   ASSESSMENT:  CLINICAL IMPRESSION: 03/27/23:  Doing wonderfully with the correct brace! He should follow-up in a week or 2 to check status and probably discharge  Eval: Patient is a 77 y.o. male who was seen today for occupational therapy evaluation for chronic Rt wrist pain that does appear to be resulting from instability and chronic TFCC tear (does not appear to be ECU tendinitis).  He has difficulty shaking hands and performing desired leisure activities. He will benefit from OP OT to decrease pain and increase quality of life.      PLAN:  OT FREQUENCY: 1-2x/week  OT DURATION: 6 weeks  PLANNED INTERVENTIONS: self care/ADL training, therapeutic exercise, therapeutic activity, neuromuscular re-education, manual therapy, splinting, electrical stimulation, ultrasound, fluidotherapy, compression bandaging, moist heat, cryotherapy, contrast bath, patient/family education, energy conservation, coping strategies training, DME and/or AE instructions, and Dry needling  CONSULTED AND AGREED WITH PLAN OF CARE: Patient  PLAN FOR NEXT SESSION:   Follow-up in 1 or 2 weeks to do a progress note, address any remaining issues or concerns and likely discharge.  If he has not heard of in the next 2 to 3 weeks he can be discharged as well and assumed that he met all of his goals and is pleased.  Fannie Knee, OTR/L 03/27/2023, 2:30 PM

## 2023-03-27 ENCOUNTER — Encounter: Payer: Self-pay | Admitting: Rehabilitative and Restorative Service Providers"

## 2023-03-27 ENCOUNTER — Ambulatory Visit: Payer: PPO | Admitting: Rehabilitative and Restorative Service Providers"

## 2023-03-27 DIAGNOSIS — M25531 Pain in right wrist: Secondary | ICD-10-CM | POA: Diagnosis not present

## 2023-03-27 DIAGNOSIS — M6281 Muscle weakness (generalized): Secondary | ICD-10-CM | POA: Diagnosis not present

## 2023-03-27 DIAGNOSIS — M25631 Stiffness of right wrist, not elsewhere classified: Secondary | ICD-10-CM | POA: Diagnosis not present

## 2023-03-27 DIAGNOSIS — R278 Other lack of coordination: Secondary | ICD-10-CM | POA: Diagnosis not present

## 2023-04-03 ENCOUNTER — Encounter: Payer: Self-pay | Admitting: Rehabilitative and Restorative Service Providers"

## 2023-04-03 ENCOUNTER — Ambulatory Visit: Payer: PPO | Admitting: Rehabilitative and Restorative Service Providers"

## 2023-04-03 DIAGNOSIS — M25631 Stiffness of right wrist, not elsewhere classified: Secondary | ICD-10-CM

## 2023-04-03 DIAGNOSIS — R278 Other lack of coordination: Secondary | ICD-10-CM | POA: Diagnosis not present

## 2023-04-03 DIAGNOSIS — M25531 Pain in right wrist: Secondary | ICD-10-CM

## 2023-04-03 DIAGNOSIS — M6281 Muscle weakness (generalized): Secondary | ICD-10-CM | POA: Diagnosis not present

## 2023-04-03 NOTE — Therapy (Signed)
OUTPATIENT OCCUPATIONAL THERAPY TREATMENT & DISCHARGE NOTE  Patient Name: Kenneth Ingram. MRN: [id number] DOB:Mar 24, 1946, 77 y.o., male 76 Date: 04/03/2023  PCP: Merri Brunette, MD REFERRING PROVIDER: Rodolph Bong, MD     OCCUPATIONAL THERAPY DISCHARGE SUMMARY  Visits from Start of Care: 3  Current functional level related to goals / functional outcomes: 04/03/23: He has no more pain or problems shooting, shaking hands, lifting weights etc.  He states "I am surprised that I could lift 35 pound rows today with no pain.  He states happy and understanding his home exercise program and he can always return with a new order if needed in the future for any new or recurrent problems.  Remaining deficits: Pt has no more significant functional deficits or pain.  Education / Equipment: Pt has all needed materials and education. Pt understands how to continue on with self-management. See tx notes for more details.  Patient agrees to discharge due to max benefits received from outpatient occupational therapy / hand therapy at this time.   Fannie Knee, OTR/L, CHT 04/03/23    END OF SESSION:  OT End of Session - 04/03/23 1346     Visit Number 3    Number of Visits 8    Date for OT Re-Evaluation 04/28/23    Authorization Type [employer]    OT Start Time 1346    OT Stop Time 1414    OT Time Calculation (min) 28 min    Activity Tolerance Patient tolerated treatment well;No increased pain    Behavior During Therapy [facility] for tasks assessed/performed              Past Medical History:  Diagnosis Date   Barrett's esophagus    Cataract    bil cateracts removed 2008   GERD (gastroesophageal reflux disease)    Hyperlipidemia    Palpitations 03/29/2019   Sleep apnea    pt does not wear a c-pap   Vertigo    Past Surgical History:  Procedure Laterality Date   CATARACT EXTRACTION W/ INTRAOCULAR LENS  IMPLANT, BILATERAL  2007   COLONOSCOPY     KNEE ARTHROSCOPY   1997   right   TONSILLECTOMY  1968   UPPER GASTROINTESTINAL ENDOSCOPY     WISDOM TOOTH EXTRACTION     Patient Active Problem List   Diagnosis Date Noted   Hypothyroidism 06/30/2020   Palpitations 03/29/2019   Coronary artery calcification seen on CT scan 06/22/2018   Hyperlipidemia 06/22/2018    ONSET DATE: Chronic   REFERRING DIAG: M25.531 (ICD-10-CM) - Right wrist pain   THERAPY DIAG:  Pain in right wrist  Other lack of coordination  Stiffness of right wrist, not elsewhere classified  Muscle weakness (generalized)  Rationale for Evaluation and Treatment: Rehabilitation  PERTINENT HISTORY: Per MD notes: "cont'd R wrist pain. Pt's last visit w/ Dr. Denyse Amass, on 08/03/22 focused on her L wrist and he was given a L carpal tunnel steroid injection. He was last seen for his R wrist in 2021-22, but he decline to f/u w/ either a steroid injection or more advanced imaging. Today, pt reports Right wrist pain going for several years.  He has had pain at the ulnar wrist on and off for years worse over the last 3 months.  He had a overly aggressive handshake with a gentleman which seem to precipitate this in 2018.. States that even gripping something the wrong way will be shooting pain."   "77 y.o. male with chronic right wrist pain  thought to be a TFCC tear or perhaps ECU tenosynovitis.  Plan for trial of occupational therapy.  If not improved after 1 or 2 months recommend MRI arthrogram. "  PRECAUTIONS: None; WEIGHT BEARING RESTRICTIONS: No  SUBJECTIVE:   SUBJECTIVE STATEMENT: He states "This brace is amazing, today I was able to row with 35 pounds and had no pain."  He asks, "will I [TFCC] ever heal?"   PAIN:  Are you having pain?   None now more in the past week   PATIENT GOALS: To improve the use of his right wrist and arm with selective activities like weight lifting, shooting his rifle, and some other IADLs.   OBJECTIVE: (All objective assessments below are from initial  evaluation on: 03/13/23 unless otherwise specified.)   HAND DOMINANCE: Right   ADLs: Overall ADLs: States little to no issues with basic activities of daily living, but some motions hurt him with forearm supination wrist extension or ulnar deviation, especially with weightbearing tasks like self-care health maintenance, leisure activity rifle shooting, lifting and moving objects at times.   FUNCTIONAL OUTCOME MEASURES: 04/03/23: We reviewed the PRWE together and he states none of these things are an issue now (OT assumes score is 0 out of 100)  Eval: Patient Rated Wrist Evaluation (PRWE): Pain: 13/50; Function: 3/50; Total Score: 16/100 (Higher Score  =  More Pain and/or Debility)    UPPER EXTREMITY ROM     Shoulder to Wrist AROM Right eval Rt 04/03/23  Forearm supination 62 62  Forearm pronation  82 85  Wrist flexion 54 pain 63  Wrist extension 56 tender 65  Wrist ulnar deviation 21   Wrist radial deviation 15   Functional dart thrower's motion (F-DTM) in ulnar flexion 50   F-DTM in radial extension  62   (Blank rows = not tested)   Hand AROM Right eval  Full Fist Ability (or Gap to Distal Palmar Crease) full  Thumb Opposition  (Kapandji Scale)  full  (Blank rows = not tested)   UPPER EXTREMITY MMT:     MMT Right 03/13/23 Rt 04/03/23  Forearm supination 4/5 tender 5/5  Forearm pronation 5/5   Wrist flexion 5/5   Wrist extension 4/5 tender 5/5  Wrist ulnar deviation 4/5 tender 5/5  Wrist radial deviation 5/5   (Blank rows = not tested)  HAND FUNCTION: Eval: No observed weakness in affected Rt hand, but details will be inspected possibly in next 1-2 visits  Grip strength Right: TBD lbs, Left: TBD lbs   COORDINATION: Eval: Other than poor movement patterns at times, no observed coordination impairments with affected Rt hand.  OBSERVATIONS:   Eval: Positive ulnar fovea sign, positive piano key testing with "clunk" in DRUJ, some instability felt Rt  vs Lt wrist from  likely chronic TFCC tear.  Interestingly, provocative ulnar impaction is not painful today but possibly due to buildup of scar tissue and the chronicity of injury.  ECU was not found to be tender or painful along the muscle belly or insertion.    TODAY'S TREATMENT:  04/03/23: OT explains today that TFCC may scar down or may not, but he seems to have less instability less tenderness and pain now and more strength-so this is a good sign for potential healing or just having stability of the wrist.    Today we review his stretches, we reviewed the wear and use of his TFCC wrist strap which is working excellently to prevent all pain.  We upgraded his strengthening to include  wrist flexion, wrist extension strength which she can do with 7 pounds today for about 15 reps.  We also talk about radial deviation with 7 pounds as well.  He can also use therapy bands to do these or use a hammer.  We review pronation strengthening as well.  None of these things hurt him or make him to be sore.  He he discusses his home activities quickly and states not having any issues now.  He is encouraged to listen to his body and not push past anything that is repetitively painful.  Exercises - Wrist Prayer Stretch  - 2-3 x daily - 3-5 reps - 15 sec hold - Seated Wrist Flexion Stretch  - 3 x daily - 3 reps - 15 hold - Forearm Supination Stretch  - 3-4 x daily - 3-5 reps - 15 sec hold - Hammer Stretch or Strength   - 2-4 x daily - 1-2 sets - 5 reps - 10 sec hold - Wrist Extension with Dumbbell  - 4-6 x daily - 1 sets - 10-15 reps - Wrist Flexion with Dumbbell  - 4-6 x daily - 1 sets - 10-15 reps - Seated Wrist Radial Deviation with Dumbbell  - 4-6 x daily - 1 sets - 10-15 reps  PATIENT EDUCATION: Education details: See tx section above for details  Person educated: Patient Education method: Verbal Instruction, Teach back, Handouts  Education comprehension: States and demonstrates understanding   HOME EXERCISE  PROGRAM: Access Code: [email or web address] URL: [email or web address] Date: 03/27/2023 Prepared by: Fannie Knee   GOALS: Goals reviewed with patient? Yes   SHORT TERM GOALS: (STG required if POC>30 days) Target Date: 04/07/23  Pt will obtain protective, custom orthotic. Goal status:  MET   2.  Pt will demo/state understanding of initial HEP to improve pain levels and prerequisite motion. Goal status: 03/27/23: MET   LONG TERM GOALS: Target Date: 04/28/23  Pt will improve functional ability by decreased impairment per PRWE assessment from 16/100 to 10/100 or better, for better quality of life. Goal status: 04/03/23: MET- states no issues now  2.  Pt will improve A/ROM in Rt wrist flex/ext from mid 50*'s to at least mid 60*'s, to have functional motion for tasks like reach and grasp.  Goal status: 04/03/23: MET  3.  Pt will improve strength in Rt wrist/FA ext and supination from 4/5 MMT tenderly to at least 4+/5 MMT to have increased functional ability to carry out selfcare and higher-level homecare tasks with no difficulty. Goal status: 04/03/23: MET  4. Pt will decrease pain at worst from 8/10 to 4/10 or better to have better sleep and occupational participation in daily roles. Goal status: 04/03/23: MET- none now    ASSESSMENT:  CLINICAL IMPRESSION: 04/03/23: He has no more pain or problems shooting, shaking hands, lifting weights etc.  He states "I am surprised that I could lift 35 pound rows today with no pain.  He states happy and understanding his home exercise program and he can always return with a new order if needed in the future for any new or recurrent problems.  PLAN:  OT FREQUENCY & OT DURATION: D/C now   PLANNED INTERVENTIONS: self care/ADL training, therapeutic exercise, therapeutic activity, neuromuscular re-education, manual therapy, splinting, electrical stimulation, ultrasound, fluidotherapy, compression bandaging, moist heat, cryotherapy, contrast bath,  patient/family education, energy conservation, coping strategies training, DME and/or AE instructions, and Dry needling  CONSULTED AND AGREED WITH PLAN OF CARE: Patient  PLAN FOR NEXT SESSION:   D/ now  Fannie Knee, OTR/L 04/03/2023, 2:22 PM

## 2023-04-10 ENCOUNTER — Encounter: Payer: PPO | Admitting: Rehabilitative and Restorative Service Providers"

## 2023-04-10 DIAGNOSIS — Z23 Encounter for immunization: Secondary | ICD-10-CM | POA: Diagnosis not present

## 2023-04-10 DIAGNOSIS — K2270 Barrett's esophagus without dysplasia: Secondary | ICD-10-CM | POA: Diagnosis not present

## 2023-04-10 DIAGNOSIS — R918 Other nonspecific abnormal finding of lung field: Secondary | ICD-10-CM | POA: Diagnosis not present

## 2023-04-10 DIAGNOSIS — I2510 Atherosclerotic heart disease of native coronary artery without angina pectoris: Secondary | ICD-10-CM | POA: Diagnosis not present

## 2023-04-10 DIAGNOSIS — I700 Atherosclerosis of aorta: Secondary | ICD-10-CM | POA: Diagnosis not present

## 2023-04-10 DIAGNOSIS — K219 Gastro-esophageal reflux disease without esophagitis: Secondary | ICD-10-CM | POA: Diagnosis not present

## 2023-04-10 DIAGNOSIS — E039 Hypothyroidism, unspecified: Secondary | ICD-10-CM | POA: Diagnosis not present

## 2023-04-10 DIAGNOSIS — J309 Allergic rhinitis, unspecified: Secondary | ICD-10-CM | POA: Diagnosis not present

## 2023-04-10 DIAGNOSIS — G2581 Restless legs syndrome: Secondary | ICD-10-CM | POA: Diagnosis not present

## 2023-04-10 DIAGNOSIS — R053 Chronic cough: Secondary | ICD-10-CM | POA: Diagnosis not present

## 2023-04-10 DIAGNOSIS — R233 Spontaneous ecchymoses: Secondary | ICD-10-CM | POA: Diagnosis not present

## 2023-04-10 DIAGNOSIS — Z0000 Encounter for general adult medical examination without abnormal findings: Secondary | ICD-10-CM | POA: Diagnosis not present

## 2023-04-10 DIAGNOSIS — K760 Fatty (change of) liver, not elsewhere classified: Secondary | ICD-10-CM | POA: Diagnosis not present

## 2023-04-17 ENCOUNTER — Encounter: Payer: PPO | Admitting: Rehabilitative and Restorative Service Providers"

## 2023-05-26 DIAGNOSIS — Z23 Encounter for immunization: Secondary | ICD-10-CM | POA: Diagnosis not present

## 2023-05-30 ENCOUNTER — Other Ambulatory Visit: Payer: Self-pay

## 2023-05-30 DIAGNOSIS — R1900 Intra-abdominal and pelvic swelling, mass and lump, unspecified site: Secondary | ICD-10-CM | POA: Diagnosis not present

## 2023-06-01 ENCOUNTER — Ambulatory Visit
Admission: RE | Admit: 2023-06-01 | Discharge: 2023-06-01 | Disposition: A | Discharge status: Home / Self Care | Payer: PPO | Source: Ambulatory Visit

## 2023-06-01 DIAGNOSIS — K4090 Unilateral inguinal hernia, without obstruction or gangrene, not specified as recurrent: Secondary | ICD-10-CM | POA: Diagnosis not present

## 2023-06-01 DIAGNOSIS — K760 Fatty (change of) liver, not elsewhere classified: Secondary | ICD-10-CM | POA: Diagnosis not present

## 2023-06-01 DIAGNOSIS — R1900 Intra-abdominal and pelvic swelling, mass and lump, unspecified site: Secondary | ICD-10-CM

## 2023-06-01 DIAGNOSIS — R1903 Right lower quadrant abdominal swelling, mass and lump: Secondary | ICD-10-CM | POA: Diagnosis not present

## 2023-06-05 DIAGNOSIS — L812 Freckles: Secondary | ICD-10-CM | POA: Diagnosis not present

## 2023-06-05 DIAGNOSIS — D692 Other nonthrombocytopenic purpura: Secondary | ICD-10-CM | POA: Diagnosis not present

## 2023-06-05 DIAGNOSIS — L821 Other seborrheic keratosis: Secondary | ICD-10-CM | POA: Diagnosis not present

## 2023-06-05 DIAGNOSIS — D1801 Hemangioma of skin and subcutaneous tissue: Secondary | ICD-10-CM | POA: Diagnosis not present

## 2023-06-15 ENCOUNTER — Other Ambulatory Visit: Payer: Self-pay

## 2023-06-15 ENCOUNTER — Ambulatory Visit: Payer: PPO | Admitting: Family Medicine

## 2023-06-15 VITALS — BP 128/74 | HR 75 | Ht 71.0 in | Wt 199.0 lb

## 2023-06-15 DIAGNOSIS — G5602 Carpal tunnel syndrome, left upper limb: Secondary | ICD-10-CM

## 2023-06-15 NOTE — Progress Notes (Signed)
Kenneth Payor, PhD, LAT, ATC acting as a scribe for Kenneth Graham, MD.  Kenneth Ingram. is a 77 y.o. male who presents to [facility] today for cont'd L wrist pain. Pt last L carpal tunnel steroid injection was on 08/03/22.  Today, pt reports L wrist pain returned 2 nights ago. He c/o pain waking him up in the middle of the night. Pt locates pain to the L elbow across the wrist and through the 1st-3rd fingers. He shooting in trap shooting tournament this week. He is also wondering about surgery.    Pertinent review of systems: no fever or chills  Relevant historical information: Hypothyroidism   Exam:  BP 128/74   Pulse 75   Ht 5\' 11"  (1.803 m)   Wt 199 lb (90.3 kg)   SpO2 95%   BMI 27.75 kg/m  General: Well Developed, well nourished, and in no acute distress.   MSK: Left wrist normal. Positive Tinel's carpal tunnel normal hand motion and strength    Lab and Radiology Results  Procedure: Real-time Ultrasound Guided hydrodissection median nerve left carpal tunnel Device: Philips Affiniti 50G/GE Logiq Images permanently stored and available for review in PACS Verbal informed consent obtained.  Discussed risks and benefits of procedure. Warned about infection, bleeding, hyperglycemia damage to structures among others. Patient expresses understanding and agreement Time-out conducted.   Noted no overlying erythema, induration, or other signs of local infection.   Skin prepped in a sterile fashion.   Local anesthesia: Topical Ethyl chloride.   With sterile technique and under real time ultrasound guidance: 40 mg of Kenalog and 2 mL of lidocaine injected into carpal tunnel around median nerve. Fluid seen entering the carpal tunnel.   Completed without difficulty   Pain immediately resolved suggesting accurate placement of the medication.   Advised to call if fevers/chills, erythema, induration, drainage, or persistent bleeding.   Images  permanently stored and available for review in the ultrasound unit.  Impression: Technically successful ultrasound guided injection.         Assessment and Plan: 77 y.o. male with left carpal tunnel syndrome.  Last injection was about 10 months ago.  Plan for repeat injection today as his symptoms have returned.  He is interested in pursuing surgery therefore we will refer to neurology and arrange for an EMG and anticipate direct referral to orthopedic hand surgery following the results of the EMG/nerve conduction study.  His wife has seen Dr. Orlan Leavens at [facility] so that location would probably be preferred.   PDMP not reviewed this encounter. Orders Placed This Encounter  Procedures   [place] LIMITED JOINT SPACE STRUCTURES UP LEFT(NO LINKED CHARGES)    Order Specific Question:   Reason for Exam (SYMPTOM  OR DIAGNOSIS REQUIRED)    Answer:   left wrist pain    Order Specific Question:   Preferred imaging location?    Answer:   [facility] referral to Neurology    Referral Priority:   Routine    Referral Type:   Consultation    Referral Reason:   Specialty Services Required    Requested Specialty:   Neurology    Number of Visits Requested:   1   NCV with EMG(electromyography)    Standing Status:   Future    Standing Expiration Date:   06/14/2024    Order Specific Question:   Where should this test be performed?    Answer:   [facility]  Order Specific Question:   Location on body    Answer:   Upper extremity    Order Specific Question:   Laterality    Answer:   Left    Order Specific Question:   Clinical Indication    Answer:   Carpal Tunnel   No orders of the defined types were placed in this encounter.    Discussed warning signs or symptoms. Please see discharge instructions. Patient expresses understanding.   The above documentation has been reviewed and is accurate and complete Kenneth Ingram, M.D.

## 2023-06-15 NOTE — Patient Instructions (Addendum)
Thank you for coming in today.   You received an injection today. Seek immediate medical attention if the joint becomes red, extremely painful, or is oozing fluid.   Continue wearing the wrist splints at night.  You should hear soon from [facility] about the nerve conduction study.

## 2023-06-16 ENCOUNTER — Other Ambulatory Visit: Payer: Self-pay

## 2023-06-16 ENCOUNTER — Telehealth: Payer: Self-pay

## 2023-06-16 ENCOUNTER — Ambulatory Visit: Payer: PPO | Admitting: Family Medicine

## 2023-06-16 DIAGNOSIS — R202 Paresthesia of skin: Secondary | ICD-10-CM

## 2023-06-16 NOTE — Telephone Encounter (Signed)
Pt called with complaint of worsening pain s/p carpal tunnel injection yesterday. Pt notes waking with sharp pain in the wrist around 4:00 AM. He is also experiencing n/t in the hand and forearm which is not new since the injection.   Pt denies increased warmth, erythema, swelling, or drainage at the injection site.   Pt was advised that there was a numbing agent in the injection that he received yesterday which can temporarily improve sx but once the numbing agent wears off, it is not uncommon for the sx to return, possibly even slightly worse. I also advised that he should notice improvement within the next 24-48 ours with additional gradual improvement over the next 2 weeks. At about 6 weeks after injection, the medication has done about all that it will do.   I let pt know that we are more than happy to see him today, but given the bad weather and distance that he must travel to the clinic, we would leave it up to him.   Pt verbalized understanding and we reach out to [place] on Monday to let us know how he is doing.   Dr. Denyse Amass aware.

## 2023-06-19 ENCOUNTER — Ambulatory Visit: Payer: PPO | Admitting: Neurology

## 2023-06-19 DIAGNOSIS — R202 Paresthesia of skin: Secondary | ICD-10-CM | POA: Diagnosis not present

## 2023-06-19 DIAGNOSIS — G5622 Lesion of ulnar nerve, left upper limb: Secondary | ICD-10-CM

## 2023-06-19 DIAGNOSIS — G5603 Carpal tunnel syndrome, bilateral upper limbs: Secondary | ICD-10-CM

## 2023-06-19 NOTE — Telephone Encounter (Signed)
Called and spoke with patient. Notes some improvement of sx over the weekend. Expresses thanks for the call back on Friday to discuss expectations s/p injection. Notes upcoming appointment with Neurology for further eval of sx.

## 2023-06-19 NOTE — Procedures (Signed)
[facility]  [address] [place], [address]  [place], [place] [address] Tel: [phone] Fax: [phone] Test Date:  06/19/2023  Patient: Kenneth Ingram DOB: 1945/09/26 Physician: Jacquelyne Balint, MD  Sex: Male Height: 5\' 11"  Ref Phys: [name], MD  ID#: [id number]   Technician:    History: This is a 77 year old male with left arm pain.  NCV & EMG Findings: Extensive electrodiagnostic evaluation of the left upper limb with additional nerve conduction studies in the right upper limb shows: Left median sensory response is absent. Right median sensory response shows prolonged distal peak latency (3.9 ms). Left ulnar sensory response is borderline, but within normal limits. Right ulnar sensory response is within normal limits. Left radial sensory response is within normal limits. Bilateral median (APB) motor responses show prolonged distal onset latencies (L5.3, R4.2 ms) and reduced amplitudes (L3.9, R4.7 mV). Left ulnar (ADM) motor response shows a 33% reduction in amplitude from wrist to above elbow stimulation sites and a conduction velocity slowing from above elbow to below elbow stimulation sites (38 m/s). Right ulnar (ADM) motor response is within normal limits. Chronic motor axon loss changes without accompanying active denervation changes are seen in the left first dorsal interosseous, abductor digiti minimi, flexor digitorum profundus to digits 4,5, and abductor pollicis brevis muscles.  Impression: This is an abnormal study. The findings are most consistent with the following: Bilateral median mononeuropathy at or distal to the wrist, consistent with carpal tunnel syndrome. Findings are severe in degree electrically on the left and moderate in degree electrically on the right. Evidence of a left ulnar mononeuropathy, proximal to the take off to the flexor digitorum profundus, likely at the elbow given the conduction velocity slowing across the elbow. Findings are mild in degree  electrically. No electrodiagnostic evidence of a right ulnar mononeuropathy. No definite electrodiagnostic evidence of a left cervical (C5-T1) motor radiculopathy.    ___________________________ Jacquelyne Balint, MD    Nerve Conduction Studies Motor Nerve Results    Latency Amplitude F-Lat Segment Distance CV Comment  Site (ms) Norm (mV) Norm (ms)  (cm) (m/s) Norm   Left Median (APB) Motor  Wrist *5.3  < 4.0 *3.9  > 5.0        Elbow 10.5 - 3.5 -  Elbow-Wrist 30.5 59  > 50   Right Median (APB) Motor  Wrist *4.2  < 4.0 *4.7  > 5.0        Left Ulnar (ADM) Motor  Wrist 2.1  < 3.1 9.3  > 7.0        Bel elbow 6.1 - 7.9 -  Bel elbow-Wrist 23 58  > 50   Ab elbow 8.7 - 7.2 -  Ab elbow-Bel elbow 10 38 -   Right Ulnar (ADM) Motor  Wrist 2.0  < 3.1 10.4  > 7.0        Bel elbow 6.3 - 9.3 -  Bel elbow-Wrist 23 53  > 50   Ab elbow 8.3 - 8.7 -  Ab elbow-Bel elbow 10 50 -    Sensory Sites    Neg Peak Lat Amplitude (O-P) Segment Distance Velocity Comment  Site (ms) Norm (V) Norm  (cm) (ms)   Left Median Sensory  Wrist-Dig II NR  < 3.8 NR  > 10 Wrist-Dig II 13    Right Median Sensory  Wrist-Dig II *3.9  < 3.8 11  > 10 Wrist-Dig II 13    Left Radial Sensory  Forearm-Wrist 2.2  < 2.8 16  >  10 Forearm-Wrist 10    Left Ulnar Sensory  Wrist-Dig V 2.8  < 3.2 6  > 5 Wrist-Dig V 11    Right Ulnar Sensory  Wrist-Dig V 2.8  < 3.2 10  > 5 Wrist-Dig V 11     Electromyography   Side Muscle Ins.Act Fibs Fasc Recrt Amp Dur Poly Activation Comment  Left FDI Nml Nml Nml *1- *1+ *1+ Nml Nml N/A  Left ADM Nml Nml Nml *1- *1+ *1+ Nml Nml N/A  Left EIP Nml Nml Nml Nml Nml Nml Nml Nml N/A  Left APB Nml Nml Nml *1- *1+ *1+ Nml Nml N/A  Left Pronator teres Nml Nml Nml Nml Nml Nml Nml Nml N/A  Left FDP Nml Nml Nml *1- *1+ *1+ Nml Nml N/A  Left Biceps Nml Nml Nml Nml Nml Nml Nml Nml N/A  Left Triceps Nml Nml Nml Nml Nml Nml Nml Nml N/A  Left Deltoid Nml Nml Nml Nml Nml Nml Nml Nml N/A       Waveforms:  Motor           Sensory

## 2023-06-20 ENCOUNTER — Other Ambulatory Visit: Payer: Self-pay

## 2023-06-20 ENCOUNTER — Telehealth: Payer: Self-pay | Admitting: Family Medicine

## 2023-06-20 DIAGNOSIS — G5602 Carpal tunnel syndrome, left upper limb: Secondary | ICD-10-CM

## 2023-06-20 NOTE — Telephone Encounter (Signed)
Pt responding to Dr. Zollie Pee note on his EMG, he WOULD like to be referred to Dr. Orlan Leavens.

## 2023-06-20 NOTE — Progress Notes (Signed)
Nerve conduction study shows severe left and medium right carpal tunnel syndrome.  Additionally the left side has evidence of cubital tunnel syndrome where the ulnar nerve in the elbow gets pinched.  Would you like me to refer you to hand surgeon now so that you can talk surgical options? I believe you had discussed seeing Dr. Orlan Leavens?

## 2023-06-27 DIAGNOSIS — G5602 Carpal tunnel syndrome, left upper limb: Secondary | ICD-10-CM | POA: Diagnosis not present

## 2023-07-10 ENCOUNTER — Ambulatory Visit: Payer: Self-pay | Admitting: Surgery

## 2023-07-10 DIAGNOSIS — K4020 Bilateral inguinal hernia, without obstruction or gangrene, not specified as recurrent: Secondary | ICD-10-CM | POA: Diagnosis not present

## 2023-07-10 NOTE — H&P (Signed)
Subjective   Chief Complaint: New Consultation (Abdominal wall bulge/possible hernia)     History of Present Illness: Kenneth Healan. is a 77 y.o. male who is seen today as an office consultation at the request of NP Arlana Pouch for evaluation of New Consultation (Abdominal wall bulge/possible hernia) .    This is a 77 year old male in excellent health who presents with a 1 month history of a visible palpable right groin bulge.  There is some associated discomfort.  The patient has been working out with a trainer and has been working on his core muscles.  He has some mild discomfort in the left groin as well but does not see any bulge.  The discomfort radiates down towards his testicle.  He denies any GI obstructive symptoms.  No urinary symptoms.  The patient and his excellent health.  He does take a baby aspirin each day.   Review of Systems: A complete review of systems was obtained from the patient.  I have reviewed this information and discussed as appropriate with the patient.  See HPI as well for other ROS.  Review of Systems  Constitutional: Negative.   HENT: Negative.    Eyes: Negative.   Respiratory: Negative.    Cardiovascular: Negative.   Gastrointestinal: Negative.   Genitourinary: Negative.   Musculoskeletal: Negative.   Skin: Negative.   Neurological: Negative.   Endo/Heme/Allergies: Negative.   Psychiatric/Behavioral: Negative.        Medical History: Past Medical History:  Diagnosis Date   Barrett esophagus    Cataracts, bilateral    GERD (gastroesophageal reflux disease)    Hyperlipidemia    Palpitations 03/29/2019   Sleep apnea    Vertigo     Patient Active Problem List  Diagnosis   Carpal tunnel syndrome of left wrist   Coronary artery calcification seen on CT scan   Hyperlipidemia   Hypothyroidism   Palpitations    Past Surgical History:  Procedure Laterality Date   TONSILLECTOMY  1968   KNEE ARTHROSCOPY  1997   CATARACT EXTRACTION W/  INTRAOCULAR LENS IMPLANT & ANTERIOR VITRECTOMY, BILATERAL  2007   COLONOSCOPY     wisdom tooth extraction       No Known Allergies  Current Outpatient Medications on File Prior to Visit  Medication Sig Dispense Refill   aspirin 81 MG EC tablet Take 81 mg by mouth once daily     atorvastatin (LIPITOR) 10 MG tablet      levothyroxine (SYNTHROID) 75 MCG tablet Take 75 mcg by mouth once daily     pantoprazole (PROTONIX) 40 MG DR tablet Take 40 mg by mouth once daily     No current facility-administered medications on file prior to visit.    Family History  Problem Relation Age of Onset   Esophageal cancer Mother      Social History   Tobacco Use  Smoking Status Former   Types: Cigarettes  Smokeless Tobacco Not on file     Social History   Socioeconomic History   Marital status: Married  Tobacco Use   Smoking status: Former    Types: Cigarettes  Vaping Use   Vaping status: Unknown  Substance and Sexual Activity   Alcohol use: Yes   Drug use: Never    Objective:    Vitals:   07/10/23 0939  BP: (!) 148/77  Pulse: 82  Temp: 37.1 C (98.8 F)  SpO2: 98%  Weight: 87.6 kg (193 lb 3.2 oz)  Height: 177.8 cm (5\' 10" )  PainSc: 0-No pain    Body mass index is 27.72 kg/m.  Physical Exam   Constitutional:  WDWN in NAD, conversant, no obvious deformities; lying in bed comfortably Eyes:  Pupils equal, round; sclera anicteric; moist conjunctiva; no lid lag HENT:  Oral mucosa moist; good dentition  Neck:  No masses palpated, trachea midline; no thyromegaly Lungs:  CTA bilaterally; normal respiratory effort CV:  Regular rate and rhythm; no murmurs; extremities well-perfused with no edema Abd:  +bowel sounds, soft, non-tender, no palpable organomegaly; no palpable hernias Musc: Normal gait; no apparent clubbing or cyanosis in extremities GU: Bilateral descended testes, no testicular masses, large visible reducible right inguinal hernia.  Smaller reducible left inguinal  hernia Lymphatic:  No palpable cervical or axillary lymphadenopathy Skin:  Warm, dry; no sign of jaundice Psychiatric - alert and oriented x 4; calm mood and affect   Labs, Imaging and Diagnostic Testing: CLINICAL DATA:  Right lower quadrant bulging.   EXAM: CT ABDOMEN AND PELVIS WITHOUT CONTRAST   TECHNIQUE: Multidetector CT imaging of the abdomen and pelvis was performed following the standard protocol without IV contrast.   RADIATION DOSE REDUCTION: This exam was performed according to the departmental dose-optimization program which includes automated exposure control, adjustment of the mA and/or kV according to patient size and/or use of iterative reconstruction technique.   COMPARISON:  None Available.   FINDINGS: Lower chest: Lung bases clear.  No pleural or pericardial effusion.   Hepatobiliary: Diffuse fatty infiltration. No focal lesions. No biliary ductal dilatation. Unremarkable gallbladder.   Pancreas: Unremarkable. No pancreatic ductal dilatation or surrounding inflammatory changes.   Spleen: Normal in size without focal abnormality.   Adrenals/Urinary Tract: Adrenal glands are unremarkable. Kidneys are normal, without renal calculi, focal lesion, or hydronephrosis. Bladder is unremarkable.   Stomach/Bowel: Stomach wall thickening noted which is nonspecific on CT scan. Correlate with symptoms of peptic ulcer disease. No bowel dilatation to suggest obstruction. Oral contrast progressed to the sigmoid colon. Unremarkable appendix.   Vascular/Lymphatic: Aortic atherosclerosis. No enlarged abdominal or pelvic lymph nodes.   Reproductive: Prostate is unremarkable.   Other: There are small fat containing bilateral inguinal hernias. No abdominopelvic ascites.   Musculoskeletal: No acute or significant osseous findings. Thoracolumbosacral degenerative changes.   IMPRESSION: 1. Fatty liver. 2. Fat containing small inguinal hernias. 3. Gastric wall  thickening which may be normal peristalsis. Clinical correlation recommended.     Electronically Signed   By: Layla Maw M.D.   On: 06/13/2023 14:23  Assessment and Plan:  Diagnoses and all orders for this visit:  Non-recurrent bilateral inguinal hernia without obstruction or gangrene  Recommend open bilateral inguinal hernia repairs with mesh.The surgical procedure has been discussed with the patient.  Potential risks, benefits, alternative treatments, and expected outcomes have been explained.  All of the patient's questions at this time have been answered.  The likelihood of reaching the patient's treatment goal is good.  The patient understands the proposed surgical procedure and wishes to proceed.    Raelene Trew Delbert Harness, MD  07/10/2023 11:26 AM

## 2023-07-13 DIAGNOSIS — G5602 Carpal tunnel syndrome, left upper limb: Secondary | ICD-10-CM | POA: Diagnosis not present

## 2023-07-18 DIAGNOSIS — K4020 Bilateral inguinal hernia, without obstruction or gangrene, not specified as recurrent: Secondary | ICD-10-CM | POA: Diagnosis not present

## 2023-07-18 DIAGNOSIS — G8918 Other acute postprocedural pain: Secondary | ICD-10-CM | POA: Diagnosis not present

## 2023-08-01 DIAGNOSIS — Z961 Presence of intraocular lens: Secondary | ICD-10-CM | POA: Diagnosis not present

## 2023-08-01 DIAGNOSIS — H52203 Unspecified astigmatism, bilateral: Secondary | ICD-10-CM | POA: Diagnosis not present

## 2023-08-01 DIAGNOSIS — D3132 Benign neoplasm of left choroid: Secondary | ICD-10-CM | POA: Diagnosis not present

## 2023-09-08 DIAGNOSIS — J069 Acute upper respiratory infection, unspecified: Secondary | ICD-10-CM | POA: Diagnosis not present

## 2023-11-28 DIAGNOSIS — S41112A Laceration without foreign body of left upper arm, initial encounter: Secondary | ICD-10-CM | POA: Diagnosis not present

## 2024-01-17 ENCOUNTER — Telehealth: Payer: Self-pay | Admitting: Orthopaedic Surgery

## 2024-01-17 NOTE — Telephone Encounter (Signed)
 Patient called and said she has been trying to contact debbie to schedule her surgery several times and she will not call her back. Now patient stating that the pain is going down in her ankles and she hasn't had any sleep for 5 days because of this pain. CB#[phone] or [phone]

## 2024-01-18 NOTE — Telephone Encounter (Signed)
 See message.  This is actually in regards to Kenneth Ingram.  Thanks.

## 2024-03-07 DIAGNOSIS — L509 Urticaria, unspecified: Secondary | ICD-10-CM | POA: Diagnosis not present

## 2024-04-11 DIAGNOSIS — R7303 Prediabetes: Secondary | ICD-10-CM | POA: Diagnosis not present

## 2024-04-11 DIAGNOSIS — Z125 Encounter for screening for malignant neoplasm of prostate: Secondary | ICD-10-CM | POA: Diagnosis not present

## 2024-04-11 DIAGNOSIS — E039 Hypothyroidism, unspecified: Secondary | ICD-10-CM | POA: Diagnosis not present

## 2024-04-11 DIAGNOSIS — E7800 Pure hypercholesterolemia, unspecified: Secondary | ICD-10-CM | POA: Diagnosis not present

## 2024-04-16 DIAGNOSIS — K2270 Barrett's esophagus without dysplasia: Secondary | ICD-10-CM | POA: Diagnosis not present

## 2024-04-16 DIAGNOSIS — J309 Allergic rhinitis, unspecified: Secondary | ICD-10-CM | POA: Diagnosis not present

## 2024-04-16 DIAGNOSIS — I2510 Atherosclerotic heart disease of native coronary artery without angina pectoris: Secondary | ICD-10-CM | POA: Diagnosis not present

## 2024-04-16 DIAGNOSIS — Z0000 Encounter for general adult medical examination without abnormal findings: Secondary | ICD-10-CM | POA: Diagnosis not present

## 2024-04-16 DIAGNOSIS — R29898 Other symptoms and signs involving the musculoskeletal system: Secondary | ICD-10-CM | POA: Diagnosis not present

## 2024-04-16 DIAGNOSIS — Z7982 Long term (current) use of aspirin: Secondary | ICD-10-CM | POA: Diagnosis not present

## 2024-04-16 DIAGNOSIS — E039 Hypothyroidism, unspecified: Secondary | ICD-10-CM | POA: Diagnosis not present

## 2024-04-16 DIAGNOSIS — I700 Atherosclerosis of aorta: Secondary | ICD-10-CM | POA: Diagnosis not present

## 2024-04-18 ENCOUNTER — Ambulatory Visit: Admitting: Sports Medicine

## 2024-04-18 ENCOUNTER — Encounter: Payer: Self-pay | Admitting: Sports Medicine

## 2024-04-18 ENCOUNTER — Other Ambulatory Visit (INDEPENDENT_AMBULATORY_CARE_PROVIDER_SITE_OTHER): Payer: Self-pay

## 2024-04-18 DIAGNOSIS — M65342 Trigger finger, left ring finger: Secondary | ICD-10-CM

## 2024-04-18 DIAGNOSIS — M79642 Pain in left hand: Secondary | ICD-10-CM | POA: Diagnosis not present

## 2024-04-18 DIAGNOSIS — M19042 Primary osteoarthritis, left hand: Secondary | ICD-10-CM

## 2024-04-18 NOTE — Progress Notes (Unsigned)
 Kenneth Ingram. - 78 y.o. male MRN [id number]  Date of birth: Feb 26, 1946  Office Visit Note: Visit Date: 04/18/2024 PCP: Kenneth Nottingham, MD Referred by: Kenneth Nottingham, MD  Subjective: Chief Complaint  Patient presents with  . Left Hand - Pain   HPI: Kenneth Ingram. is a pleasant 78 y.o. male who presents today for ***  Pertinent ROS were reviewed with the patient and found to be negative unless otherwise specified above in HPI.   Assessment & Plan: Visit Diagnoses: No diagnosis found.  Plan: ***  Follow-up: No follow-ups on file.   Meds & Orders: No orders of the defined types were placed in this encounter.  No orders of the defined types were placed in this encounter.    Procedures: Hand/UE Inj: L ring A1 for trigger finger on 04/18/2024 11:38 AM Indications: diagnostic and pain Details: 25 G needle, volar approach Medications: 1 mL lidocaine 1 %; 6 mg betamethasone acetate-betamethasone sodium phosphate 6 (3-3) MG/ML Outcome: tolerated well, no immediate complications Procedure, treatment alternatives, risks and benefits explained, specific risks discussed. Consent was given by the patient. Patient was prepped and draped in the usual sterile fashion.          Clinical History: No specialty comments available.  He reports that he quit smoking about 29 years ago. His smoking use included cigarettes. He started smoking about 49 years ago. He has a 20 pack-year smoking history. He has never used smokeless tobacco. No results for input(s): HGBA1C, LABURIC in the last 8760 hours.  Objective:   Vital Signs: There were no vitals taken for this visit.  Physical Exam  Gen: Well-appearing, in no acute distress; non-toxic CV: Well-perfused. Warm.  Resp: Breathing unlabored on room air; no wheezing. Psych: Fluid speech in conversation; appropriate affect; normal thought process  Ortho Exam - Left hand: ***  Imaging: No results found.  Past  Medical/Family/Surgical/Social History: Medications & Allergies reviewed per EMR, new medications updated. Patient Active Problem List   Diagnosis Date Noted  . Carpal tunnel syndrome of left wrist 06/15/2023  . Hypothyroidism 06/30/2020  . Palpitations 03/29/2019  . Coronary artery calcification seen on CT scan 06/22/2018  . Hyperlipidemia 06/22/2018   Past Medical History:  Diagnosis Date  . Barrett's esophagus   . Cataract    bil cateracts removed 2008  . GERD (gastroesophageal reflux disease)   . Hyperlipidemia   . Palpitations 03/29/2019  . Sleep apnea    pt does not wear a c-pap  . Vertigo    Family History  Problem Relation Age of Onset  . Esophageal cancer Mother 26       died soon after dx  . Colon cancer Neg Hx   . Pancreatic cancer Neg Hx   . Prostate cancer Neg Hx   . Rectal cancer Neg Hx   . Stomach cancer Neg Hx    Past Surgical History:  Procedure Laterality Date  . CATARACT EXTRACTION W/ INTRAOCULAR LENS  IMPLANT, BILATERAL  2007  . COLONOSCOPY    . KNEE ARTHROSCOPY  1997   right  . TONSILLECTOMY  1968  . UPPER GASTROINTESTINAL ENDOSCOPY    . WISDOM TOOTH EXTRACTION     Social History   Occupational History  . Not on file  Tobacco Use  . Smoking status: Former    Current packs/day: 0.00    Average packs/day: 1 pack/day for 20.0 years (20.0 ttl pk-yrs)    Types: Cigarettes    Start date: 75  Quit date: 76    Years since quitting: 29.6  . Smokeless tobacco: Never  Vaping Use  . Vaping status: Never Used  Substance and Sexual Activity  . Alcohol use: Yes    Alcohol/week: 4.0 standard drinks of alcohol    Types: 4 Glasses of wine per week    Comment: DAILY  . Drug use: No  . Sexual activity: Not on file

## 2024-04-18 NOTE — Progress Notes (Unsigned)
 Patient says that he had carpal tunnel surgery in October of last year, and in much of the time since has had pain in the left hand. He has been unable to make a fist with the left hand due to pain and feeling as though it gets stuck. He says that it is primarily the third and fourth fingers that are bothersome. He has not taken any medication or done any at-home treatment for his pain. He is inquiring about shockwave therapy or other treatment that may help.

## 2024-04-19 ENCOUNTER — Encounter: Payer: Self-pay | Admitting: Sports Medicine

## 2024-04-19 MED ORDER — LIDOCAINE HCL 1 % IJ SOLN
1.0000 mL | INTRAMUSCULAR | Status: AC | PRN
  Administered 2024-04-18: 1 mL

## 2024-04-19 MED ORDER — BETAMETHASONE SOD PHOS & ACET 6 (3-3) MG/ML IJ SUSP
6.0000 mg | INTRAMUSCULAR | Status: AC | PRN
  Administered 2024-04-18: 6 mg via INTRA_ARTICULAR

## 2024-04-30 DIAGNOSIS — M1812 Unilateral primary osteoarthritis of first carpometacarpal joint, left hand: Secondary | ICD-10-CM | POA: Diagnosis not present

## 2024-04-30 DIAGNOSIS — M19042 Primary osteoarthritis, left hand: Secondary | ICD-10-CM | POA: Diagnosis not present

## 2024-04-30 DIAGNOSIS — M1811 Unilateral primary osteoarthritis of first carpometacarpal joint, right hand: Secondary | ICD-10-CM | POA: Diagnosis not present

## 2024-04-30 DIAGNOSIS — M19031 Primary osteoarthritis, right wrist: Secondary | ICD-10-CM | POA: Diagnosis not present

## 2024-04-30 DIAGNOSIS — M778 Other enthesopathies, not elsewhere classified: Secondary | ICD-10-CM | POA: Diagnosis not present

## 2024-04-30 DIAGNOSIS — M19032 Primary osteoarthritis, left wrist: Secondary | ICD-10-CM | POA: Diagnosis not present

## 2024-04-30 DIAGNOSIS — M79642 Pain in left hand: Secondary | ICD-10-CM | POA: Diagnosis not present

## 2024-04-30 DIAGNOSIS — M6240 Contracture of muscle, unspecified site: Secondary | ICD-10-CM | POA: Diagnosis not present

## 2024-04-30 DIAGNOSIS — M79641 Pain in right hand: Secondary | ICD-10-CM | POA: Diagnosis not present

## 2024-05-02 ENCOUNTER — Encounter: Payer: Self-pay | Admitting: Sports Medicine

## 2024-05-02 ENCOUNTER — Ambulatory Visit: Admitting: Sports Medicine

## 2024-05-02 DIAGNOSIS — M79642 Pain in left hand: Secondary | ICD-10-CM | POA: Diagnosis not present

## 2024-05-02 DIAGNOSIS — L905 Scar conditions and fibrosis of skin: Secondary | ICD-10-CM | POA: Diagnosis not present

## 2024-05-02 DIAGNOSIS — Z9889 Other specified postprocedural states: Secondary | ICD-10-CM | POA: Diagnosis not present

## 2024-05-02 DIAGNOSIS — M19042 Primary osteoarthritis, left hand: Secondary | ICD-10-CM | POA: Diagnosis not present

## 2024-05-02 NOTE — Progress Notes (Signed)
 Patient says that he thinks that the shockwave did make a difference. He is able to close his third and fourth fingers more when making a fist. He says that he is still not experiencing any true pain. He would like to try more shockwave therapy to see if he makes any further improvement with his motion.

## 2024-05-02 NOTE — Progress Notes (Signed)
 Kenneth Ingram Kenneth Ingram. - 78 y.o. male MRN [id number]  Date of birth: [birth year]  Office Visit Note: Visit Date: 05/02/2024 PCP: Kenneth Nottingham, MD Referred by: Kenneth Nottingham, MD  Subjective: Chief Complaint  Patient presents with   Left Hand - Follow-up   HPI: Kenneth Ingram. is a pleasant 78 y.o. male who presents today for follow-up of left hand/finger pain and restriction.  Kenneth Ingram underwent carpal tunnel surgery [facility] 2024   Last visit we did perform a trial of extracorporeal shockwave therapy, he does feel this made a difference.  He is able to close his middle and ring finger easier when making a fist.  He continues did not really have too much pain, other than mild discomfort when he does push-ups with a closed fist.  He still has some restriction with closing of the fingers with some restriction flexing the IP joints of the 3rd and 4th finger as well as restriction in the palmar aspect of the hand.  He did see [facility] on 04/30/2024, note reviewed.  Per Kenneth Ingram, they give him a few exercises for the his fingers but no true diagnosis or treatment plan.  Pertinent ROS were reviewed with the patient and found to be negative unless otherwise specified above in HPI.   Assessment & Plan: Visit Diagnoses:  1. Pain in left hand   2. Primary osteoarthritis, left hand   3. Status post carpal tunnel release   4. Scar tissue    Plan: Impression is chronic left palmar hand pain with restriction and tightness with open/close fist.  This is in the setting of a carpal tunnel release back in 2024.  He does have some Kollmar restriction from his carpal tunnel surgery, and he did note improvement with motion and discomfort after treatment of extracorporeal shockwave therapy.  We did repeat this today.  I also would like him to be evaluated by our occupational therapist, Kenneth Ingram, for his recommendation and further treatment. Kenneth Ingram did tell me today that he does feel  he sleeps with his fingers and hand in a flexed position, he may benefit from a type of brace to help with extension and prevent tightness/closed grip positioning.  I do not appreciate any Dupuytren's contracture or triggering on exam today.  We will see him for 1 additional shockwave and further reevaluation to see what sort of cumulative benefit he is receiving and I will discuss with Kenneth Ingram more after his evaluation and workup.  Okay for over-the-counter anti-inflammatories as needed.  Follow-up: Return for f/u in 1-2 weeks for Left hand (reg visit).   Meds & Orders: No orders of the defined types were placed in this encounter.   Orders Placed This Encounter  Procedures   Ambulatory referral to Occupational Therapy     Procedures: No procedures performed      Clinical History: No specialty comments available.  He reports that he quit smoking about 29 years ago. His smoking use included cigarettes. He started smoking about 49 years ago. He has a 20 pack-year smoking history. He has never used smokeless tobacco. No results for input(s): HGBA1C, LABURIC in the last 8760 hours.  Objective:    Physical Exam  Gen: Well-appearing, in no acute distress; non-toxic CV: Well-perfused. Warm.  Resp: Breathing unlabored on room air; no wheezing. Psych: Fluid speech in conversation; appropriate affect; normal thought process  Ortho Exam - Left hand: There is a well-healed vertical incision from prior carpal  tunnel release surgery which goes beyond the carpal tunnel outlet. There is a degree of myofascial restriction with scar tissue at the distal aspect of the scar emanating into the plantar aspect of the hand. There is full ability with closed and open fist but there is the sensation of tightness and slightly less flexion at the PIP joint at the 3rd and 4th finger.   Imaging: No results found.  Past Medical/Family/Surgical/Social History: Medications & Allergies reviewed per EMR, new  medications updated. Patient Active Problem List   Diagnosis Date Noted   Carpal tunnel syndrome of left wrist 06/15/2023   Hypothyroidism 06/30/2020   Palpitations 03/29/2019   Coronary artery calcification seen on CT scan 06/22/2018   Hyperlipidemia 06/22/2018   Past Medical History:  Diagnosis Date   Barrett's esophagus    Cataract    bil cateracts removed 2008   GERD (gastroesophageal reflux disease)    Hyperlipidemia    Palpitations 03/29/2019   Sleep apnea    pt does not wear a c-pap   Vertigo    Family History  Problem Relation Age of Onset   Esophageal cancer Mother 62       died soon after dx   Colon cancer Neg Hx    Pancreatic cancer Neg Hx    Prostate cancer Neg Hx    Rectal cancer Neg Hx    Stomach cancer Neg Hx    Past Surgical History:  Procedure Laterality Date   CATARACT EXTRACTION W/ INTRAOCULAR LENS  IMPLANT, BILATERAL  2007   COLONOSCOPY     KNEE ARTHROSCOPY  1997   right   TONSILLECTOMY  1968   UPPER GASTROINTESTINAL ENDOSCOPY     WISDOM TOOTH EXTRACTION     Social History   Occupational History   Not on file  Tobacco Use   Smoking status: Former    Current packs/day: 0.00    Average packs/day: 1 pack/day for 20.0 years (20.0 ttl pk-yrs)    Types: Cigarettes    Start date: 28    Quit date: 1996    Years since quitting: 29.6   Smokeless tobacco: Never  Vaping Use   Vaping status: Never Used  Substance and Sexual Activity   Alcohol use: Yes    Alcohol/week: 4.0 standard drinks of alcohol    Types: 4 Glasses of wine per week    Comment: DAILY   Drug use: No   Sexual activity: Not on file   I spent 30 minutes in the care of the patient today including face-to-face time, preparation to see the patient, as well as discussion on conservative treatment, ESWT, benefit from eval and treatment from OT, review of previous orthopedic notes including [facility] from 04/30/24 for the above diagnoses.   Kenneth Sprang, DO Primary Care Sports  Medicine Physician  [facility]  This note was dictated using Dragon naturally speaking software and may contain errors in syntax, spelling, or content which have not been identified prior to signing this note.

## 2024-05-14 NOTE — Therapy (Signed)
 OUTPATIENT OCCUPATIONAL THERAPY ORTHO EVALUATION  Patient Name: Kenneth Ingram. MRN: [id number] DOB:[birth year], 78 y.o., male Today's Date: 05/15/2024  PCP: Clarice MOHR MD REFERRING PROVIDER: Burnetta Brunet, DO   END OF SESSION:  OT End of Session - 05/15/24 1619     Visit Number 1    Number of Visits 5    Date for OT Re-Evaluation 06/28/24    Authorization Type [employer]    OT Start Time 1619    OT Stop Time 1644    OT Time Calculation (min) 25 min    Activity Tolerance Patient tolerated treatment well;No increased pain    Behavior During Therapy [facility] for tasks assessed/performed          Past Medical History:  Diagnosis Date   Barrett's esophagus    Cataract    bil cateracts removed 2008   GERD (gastroesophageal reflux disease)    Hyperlipidemia    Palpitations 03/29/2019   Sleep apnea    pt does not wear a c-pap   Vertigo    Past Surgical History:  Procedure Laterality Date   CATARACT EXTRACTION W/ INTRAOCULAR LENS  IMPLANT, BILATERAL  2007   COLONOSCOPY     KNEE ARTHROSCOPY  1997   right   TONSILLECTOMY  1968   UPPER GASTROINTESTINAL ENDOSCOPY     WISDOM TOOTH EXTRACTION     Patient Active Problem List   Diagnosis Date Noted   Carpal tunnel syndrome of left wrist 06/15/2023   Hypothyroidism 06/30/2020   Palpitations 03/29/2019   Coronary artery calcification seen on CT scan 06/22/2018   Hyperlipidemia 06/22/2018    ONSET DATE: Oct 2024  REFERRING DIAG:  F20.357 (ICD-10-CM) - Pain in left hand  M19.042 (ICD-10-CM) - Primary osteoarthritis, left hand  Z98.890 (ICD-10-CM) - Status post carpal tunnel release    THERAPY DIAG:  Stiffness of left hand, not elsewhere classified  Muscle weakness (generalized)  Rationale for Evaluation and Treatment: Rehabilitation  SUBJECTIVE:   SUBJECTIVE STATEMENT: He states he has been managing his right wrist symptoms well since he was seen in therapy last year.  He continues to be an avid shooter  and working out.  He states after carpal tunnel release to the left wrist about a year ago, he developed tightness in his left hand, cannot make a full tight fist, has difficulty moving his hand and his wrist.  He would like strategies for this.  No significant pain now     PERTINENT HISTORY: Per MD notes:  chronic left palmar hand pain with restriction and tightness with open/close fist.  This is in the setting of a carpal tunnel release back in 2024.  He does have some Kollmar restriction from his carpal tunnel surgery, and he did note improvement with motion and discomfort after treatment of extracorporeal shockwave therapy.    He was seen in OT last year for chronic Rt wrist pain successfully discharging without issues.    PRECAUTIONS: None  RED FLAGS: None   WEIGHT BEARING RESTRICTIONS: No  PAIN:  Are you having pain? No  FALLS: Has patient fallen in last 6 months? No  PLOF: Independent  PATIENT GOALS: To improve stiffness and weakness in left hand  NEXT MD VISIT: As needed   OBJECTIVE: (All objective assessments below are from initial evaluation on: 05/15/24 unless otherwise specified.)   HAND DOMINANCE: Right   ADLs: Overall ADLs: States decreased ability to grab, hold heavier objects, slightly decreased ability with fine motor skills and making a fist  FUNCTIONAL OUTCOME MEASURES: Eval: Patient Specific Functional Scale: 5.5 (Exercising, shooting)  (Higher Score  =  Better Ability for the Selected Tasks)       UPPER EXTREMITY ROM     Shoulder to Wrist AROM Right eval Left eval  Wrist flexion 49 65  Wrist extension 59 59  (Blank rows = not tested)   Hand AROM Left eval  Full Fist Ability (or Gap to Distal Palmar Crease) Fingers touch palm barely in a loose but full fist.  Approximately 1.5 centimeter gap to distal palmar crease  Thumb Opposition  (Kapandji Scale)  Within functional limits  (Blank rows = not tested)   HAND FUNCTION: Eval: Observed  weakness in affected left hand.  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  Grip strength Right: 50 lbs, Left: 50 lbs   COORDINATION: Eval: Only mild observed coordination impairments with affected left hand, due to stiffness and intrinsic tightness.  Details TBD as needed.  SENSATION: Eval:  Light touch intact today  EDEMA:   Eval: None significant  COGNITION: Eval: Overall cognitive status: WFL for evaluation today   OBSERVATIONS:   Eval: Left hand: Positive intrinsic tightness testing in digits 3 and 4, some obvious cording in the left palm to the middle finger, also very prominent flexor tendons to the wrist showing tightness in the volar forearm  Presents as stiff left hand with intrinsic and extrinsic tightness mixture   TODAY'S TREATMENT:  Post-evaluation treatment:   He was given the following home program to perform approximately 4 times a day as able to improve flexibility and strength of the left hand.  Each 1 was performed with him today for understanding and he states feeling no pain but good stretches and good outcomes.  He would like to self manage these on his own for now   Exercises - Wrist Prayer Stretch  - 4 x daily - 3-5 reps - 15 sec hold - HOOK Stretch  - 4 x daily - 3-5 reps - 15-20 sec hold - Tendon Glides  - 4-6 x daily - 3-5 reps - 2-3 seconds hold - Seated Finger Composite Flexion Stretch  - 4 x daily - 3-5 reps - 15 hold - Median Nerve Flossing  - 2-3 x daily - 5-10 reps -towel isometric grip, 5 x 10 seconds   PATIENT EDUCATION: Education details: See tx section above for details  Person educated: Patient Education method: Engineer, structural, Teach back, Handouts  Education comprehension: States and demonstrates understanding, Additional Education required    HOME EXERCISE PROGRAM: Access Code: [email or web address] URL: [email or web address] Date: 05/15/2024 Prepared by: Melvenia Ada   GOALS: Goals  reviewed with patient? Yes   SHORT TERM GOALS: (STG required if POC>30 days) Target Date: 05/15/2024  Pt will demo/state understanding of initial HEP to improve pain levels and prerequisite motion. Goal status: Goal met   LONG TERM GOALS: Target Date: 06/28/2024  Pt will improve functional ability by decreased impairment per PSFS assessment from 5.5 to 7.5 or better, for better quality of life. Goal status: INITIAL  2.  Pt will improve grip strength in left hand from 50 lbs to at least 55 lbs for functional use at home and in IADLs. Goal status: INITIAL  3.  Pt will improve A/ROM in left wrist extension from 59 degrees to at least 65 degrees, to have functional motion for tasks like reach and grasp.  Goal status: INITIAL  4.  Pt will improve flexibility of the left  hand in flexion and extension from stiff and weak feeling to a full robust fist and full active extension to carry out selfcare and higher-level homecare tasks with less difficulty. Goal status: INITIAL    ASSESSMENT:  CLINICAL IMPRESSION: Patient is a 78 y.o. male who was seen today for occupational therapy evaluation for stiffness tightness and weakness in the left hand chronically after carpal tunnel release about a year ago.  The patient will benefit from outpatient occupational therapy to decrease symptoms, improve functional upper extremity use, and increase quality of life.  PERFORMANCE DEFICITS: in functional skills including IADLs, ROM, strength, fascial restrictions, body mechanics, and UE functional use, cognitive skills including problem solving and safety awareness, and psychosocial skills including coping strategies, environmental adaptation, habits, and routines and behaviors.   IMPAIRMENTS: are limiting patient from ADLs, IADLs, rest and sleep, and leisure.   COMORBIDITIES: may have co-morbidities  that affects occupational performance. Patient will benefit from skilled OT to address above impairments and  improve overall function.  MODIFICATION OR ASSISTANCE TO COMPLETE EVALUATION: No modification of tasks or assist necessary to complete an evaluation.  OT OCCUPATIONAL PROFILE AND HISTORY: Problem focused assessment: Including review of records relating to presenting problem.  CLINICAL DECISION MAKING: LOW - limited treatment options, no task modification necessary  REHAB POTENTIAL: Excellent  EVALUATION COMPLEXITY: Low      PLAN:  OT FREQUENCY: Up to 1x/week  OT DURATION: 6 weeks through 06/28/2024 and up to 5 total visits as needed   PLANNED INTERVENTIONS: 97535 self care/ADL training, 02889 therapeutic exercise, 97530 therapeutic activity, 97112 neuromuscular re-education, 97140 manual therapy, 97035 ultrasound, 97032 electrical stimulation (manual), 97760 Orthotic Initial, [id number] Orthotic/Prosthetic subsequent, compression bandaging, Dry needling, energy conservation, coping strategies training, and patient/family education  RECOMMENDED OTHER SERVICES: none now    CONSULTED AND AGREED WITH PLAN OF CARE: Patient  PLAN FOR NEXT SESSION:   Follow-up over the next 6 weeks as needed to check status, strength, understanding home exercise program and provide any updates as needed.  If has not heard from within 4 to 6 weeks, this plan of care will be discharged and assumed all goals are met     Melvenia Ada, OTR/L, CHT  05/15/2024, 5:11 PM

## 2024-05-15 ENCOUNTER — Encounter: Payer: Self-pay | Admitting: Rehabilitative and Restorative Service Providers"

## 2024-05-15 ENCOUNTER — Ambulatory Visit: Admitting: Rehabilitative and Restorative Service Providers"

## 2024-05-15 DIAGNOSIS — M25642 Stiffness of left hand, not elsewhere classified: Secondary | ICD-10-CM | POA: Diagnosis not present

## 2024-05-15 DIAGNOSIS — M6281 Muscle weakness (generalized): Secondary | ICD-10-CM

## 2024-05-16 ENCOUNTER — Encounter: Payer: Self-pay | Admitting: Sports Medicine

## 2024-05-16 ENCOUNTER — Ambulatory Visit: Admitting: Sports Medicine

## 2024-05-16 DIAGNOSIS — Z9889 Other specified postprocedural states: Secondary | ICD-10-CM

## 2024-05-16 DIAGNOSIS — M19042 Primary osteoarthritis, left hand: Secondary | ICD-10-CM

## 2024-05-16 DIAGNOSIS — M79642 Pain in left hand: Secondary | ICD-10-CM | POA: Diagnosis not present

## 2024-05-16 NOTE — Progress Notes (Signed)
 Patient says that he has improved more since the second shockwave therapy. He saw Kenneth Ingram yesterday and was given 6 exercises to do. He would like to see how the exercises do for him, and prefers not to do shockwave therapy today unless it is recommended.

## 2024-05-16 NOTE — Progress Notes (Signed)
 Kenneth Ingram. - 78 y.o. male MRN [id number]  Date of birth: [birth year]  Office Visit Note: Visit Date: 05/16/2024 PCP: Kenneth Nottingham, MD Referred by: Kenneth Nottingham, MD  Subjective: Chief Complaint  Patient presents with   Left Hand - Follow-up   HPI: Kenneth Ingram. is a pleasant 78 y.o. male who presents today for follow-up of left hand/finger pain with restriction.  Kenneth Ingram underwent carpal tunnel [facility] with [facility] October 2024.  We have performed 2 treatments of extracorporeal shockwave therapy which she feels has been a good help for him.  He reports both improved range of motion as well as increased grip strength.  He did meet with our occupational therapist, Spurgeon Ada and he did give him 6 exercises for him to perform.  Kenneth Ingram is pleased with the progress and he would like to move forward with his PT/HEP at this time.  Pertinent ROS were reviewed with the patient and found to be negative unless otherwise specified above in HPI.   Assessment & Plan: Visit Diagnoses:  1. Primary osteoarthritis, left hand   2. Status post carpal tunnel release   3. Pain in left hand    Plan: Impression is improving chronic left palmar hand pain with improvement in both restriction and tightness as well as reported close grip strength.  He did get some improvement with myofascial restriction and scar tissue from his carpal tunnel release after 2 sessions of extracorporeal shockwave therapy.  We will hold on additional treatments for now and he will begin his home exercise therapy that our occupational therapist gave to him yesterday.  We will let him progress through these on a daily basis for the next 5-6 weeks and I will follow-up with him at that point as needed.  Could consider additional ECSWT in the future as needed.  Okay for Tylenol or over-the-counter anti-inflammatories only as needed.  Follow-up: Return in about 5 weeks (around 06/20/2024), or if symptoms worsen or fail  to improve.   Meds & Orders: No orders of the defined types were placed in this encounter.  No orders of the defined types were placed in this encounter.    Procedures: No procedures performed      Clinical History: No specialty comments available.  He reports that he quit smoking about 29 years ago. His smoking use included cigarettes. He started smoking about 49 years ago. He has a 20 pack-year smoking history. He has never used smokeless tobacco. No results for input(s): HGBA1C, LABURIC in the last 8760 hours.  Objective:    Physical Exam  Gen: Well-appearing, in no acute distress; non-toxic CV: Well-perfused. Warm.  Resp: Breathing unlabored on room air; no wheezing. Psych: Fluid speech in conversation; appropriate affect; normal thought process  Ortho Exam - Left-hand: Well-healed vertical scar from previous carpal tunnel release, there is less myofascial restriction and scar tissue from previous examinations.  There is full ability with closed and open fist on exam today.  Imaging: No results found.  Past Medical/Family/Surgical/Social History: Medications & Allergies reviewed per EMR, new medications updated. Patient Active Problem List   Diagnosis Date Noted   Carpal tunnel syndrome of left wrist 06/15/2023   Hypothyroidism 06/30/2020   Palpitations 03/29/2019   Coronary artery calcification seen on CT scan 06/22/2018   Hyperlipidemia 06/22/2018   Past Medical History:  Diagnosis Date   Barrett's esophagus    Cataract    bil cateracts removed 2008   GERD (gastroesophageal reflux  disease)    Hyperlipidemia    Palpitations 03/29/2019   Sleep apnea    pt does not wear a c-pap   Vertigo    Family History  Problem Relation Age of Onset   Esophageal cancer Mother 31       died soon after dx   Colon cancer Neg Hx    Pancreatic cancer Neg Hx    Prostate cancer Neg Hx    Rectal cancer Neg Hx    Stomach cancer Neg Hx    Past Surgical History:  Procedure  Laterality Date   CATARACT EXTRACTION W/ INTRAOCULAR LENS  IMPLANT, BILATERAL  2007   COLONOSCOPY     KNEE ARTHROSCOPY  1997   right   TONSILLECTOMY  1968   UPPER GASTROINTESTINAL ENDOSCOPY     WISDOM TOOTH EXTRACTION     Social History   Occupational History   Not on file  Tobacco Use   Smoking status: Former    Current packs/day: 0.00    Average packs/day: 1 pack/day for 20.0 years (20.0 ttl pk-yrs)    Types: Cigarettes    Start date: 15    Quit date: 1996    Years since quitting: 29.6   Smokeless tobacco: Never  Vaping Use   Vaping status: Never Used  Substance and Sexual Activity   Alcohol use: Yes    Alcohol/week: 4.0 standard drinks of alcohol    Types: 4 Glasses of wine per week    Comment: DAILY   Drug use: No   Sexual activity: Not on file

## 2024-05-21 DIAGNOSIS — L720 Epidermal cyst: Secondary | ICD-10-CM | POA: Diagnosis not present

## 2024-05-21 DIAGNOSIS — L821 Other seborrheic keratosis: Secondary | ICD-10-CM | POA: Diagnosis not present

## 2024-05-21 DIAGNOSIS — L812 Freckles: Secondary | ICD-10-CM | POA: Diagnosis not present

## 2024-07-22 ENCOUNTER — Encounter: Payer: Self-pay | Admitting: Radiology

## 2024-07-23 DIAGNOSIS — N5203 Combined arterial insufficiency and corporo-venous occlusive erectile dysfunction: Secondary | ICD-10-CM | POA: Diagnosis not present

## 2024-07-31 DIAGNOSIS — Z961 Presence of intraocular lens: Secondary | ICD-10-CM | POA: Diagnosis not present

## 2024-07-31 DIAGNOSIS — D3132 Benign neoplasm of left choroid: Secondary | ICD-10-CM | POA: Diagnosis not present

## 2024-07-31 DIAGNOSIS — H52203 Unspecified astigmatism, bilateral: Secondary | ICD-10-CM | POA: Diagnosis not present

## 2024-08-20 DIAGNOSIS — M545 Low back pain, unspecified: Secondary | ICD-10-CM | POA: Diagnosis not present

## 2024-08-20 DIAGNOSIS — G8929 Other chronic pain: Secondary | ICD-10-CM | POA: Diagnosis not present

## 2024-08-29 DIAGNOSIS — M4316 Spondylolisthesis, lumbar region: Secondary | ICD-10-CM | POA: Diagnosis not present
